# Patient Record
Sex: Male | Born: 1975 | Race: White | Hispanic: No | Marital: Married | Smoking: Current every day smoker
Health system: Southern US, Community
[De-identification: ages and names within clinical notes are randomized; demographics above are authoritative.]

## PROBLEM LIST (undated history)

## (undated) DIAGNOSIS — F419 Anxiety disorder, unspecified: Secondary | ICD-10-CM

---

## 2000-04-01 ENCOUNTER — Encounter: Payer: Self-pay | Admitting: Emergency Medicine

## 2000-04-01 ENCOUNTER — Emergency Department (HOSPITAL_COMMUNITY): Admission: EM | Admit: 2000-04-01 | Discharge: 2000-04-01 | Payer: Self-pay | Admitting: Emergency Medicine

## 2008-12-18 ENCOUNTER — Emergency Department (HOSPITAL_COMMUNITY): Admission: EM | Admit: 2008-12-18 | Discharge: 2008-12-18 | Payer: Self-pay | Admitting: Emergency Medicine

## 2009-01-18 ENCOUNTER — Emergency Department (HOSPITAL_COMMUNITY): Admission: EM | Admit: 2009-01-18 | Discharge: 2009-01-18 | Payer: Self-pay | Admitting: Emergency Medicine

## 2010-09-02 IMAGING — CT CT ABDOMEN W/ CM
3 of 5 series · 13 of 32 positions shown, 18 images · IV contrast (100 ML OMNI 300)
Comparison: None

CT CHEST

CLINICAL DATA: Status post trauma

CT CHEST, ABDOMEN AND PELVIS WITH CONTRAST
TECHNIQUE: Multidetector CT imaging of the chest, abdomen and
pelvis was performed following the standard protocol during bolus
administration of intravenous contrast.
Contrast: 100 ml of [id number]

[Series 8: chest/abd/pelvis · axial · 0.70mm/px · z∈[-844,-724]mm · 3 of 122 slices shown]
[im 13/122  soft-tissue]
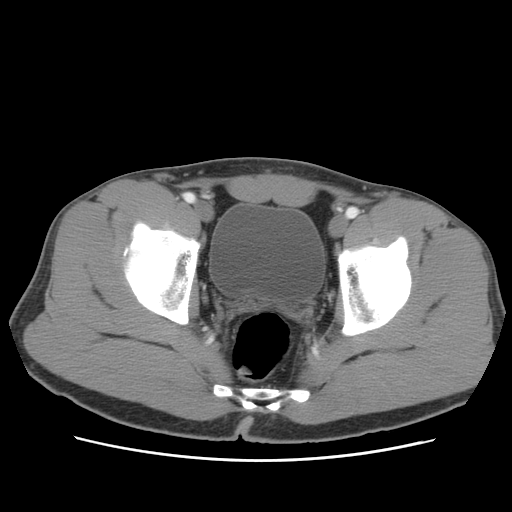
[im 25/122  soft-tissue]
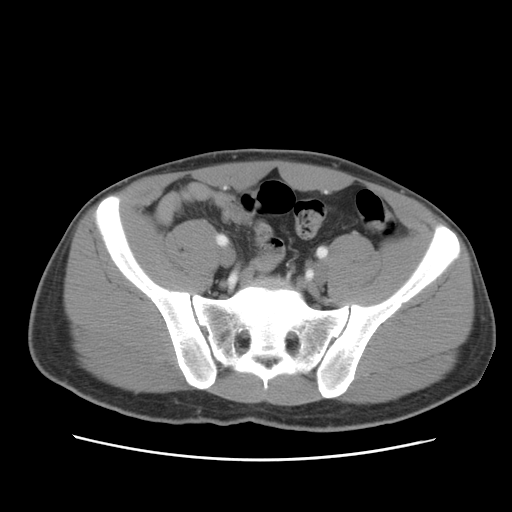
[im 37/122  soft-tissue]
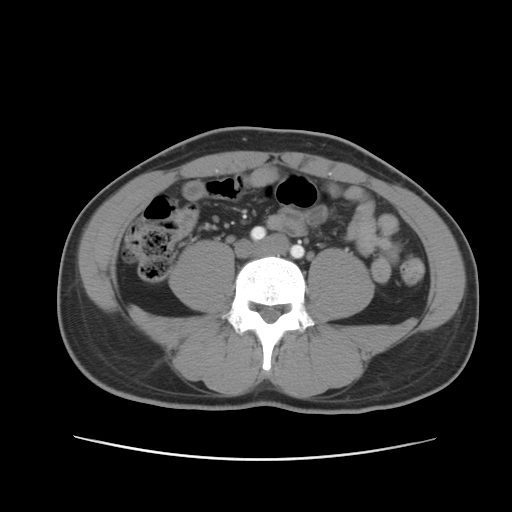

[Series 1000: reformatted · sagittal · 1.21mm/px · 6 of 98 slices shown, 11 images (1 of 2)]
[im 14/98  soft-tissue]
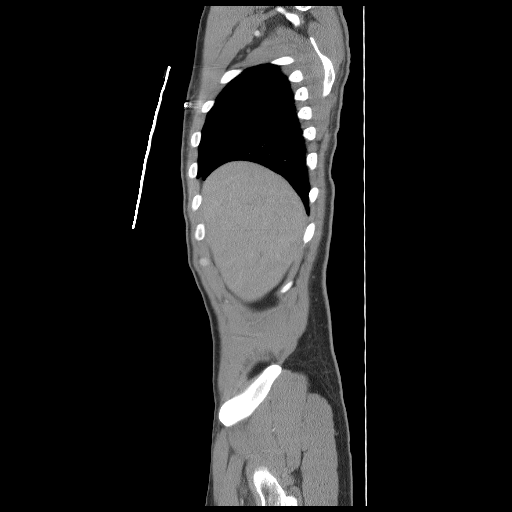
[im 14/98  lung]
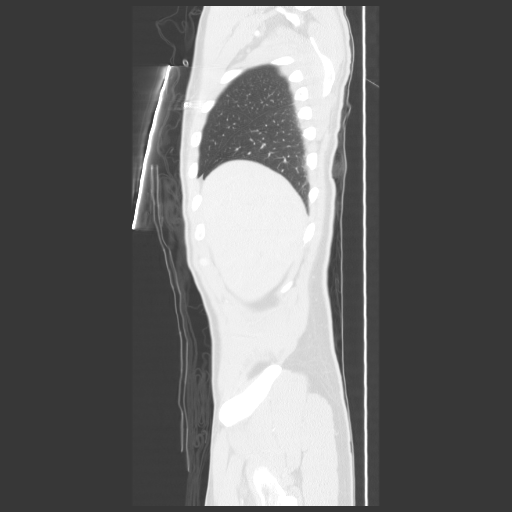
[im 14/98  bone]
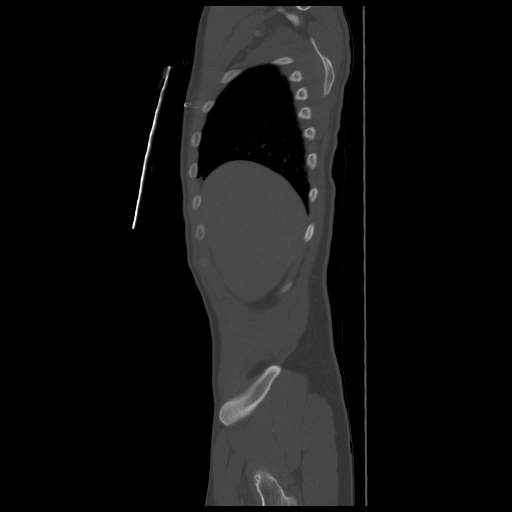
[im 28/98  soft-tissue]
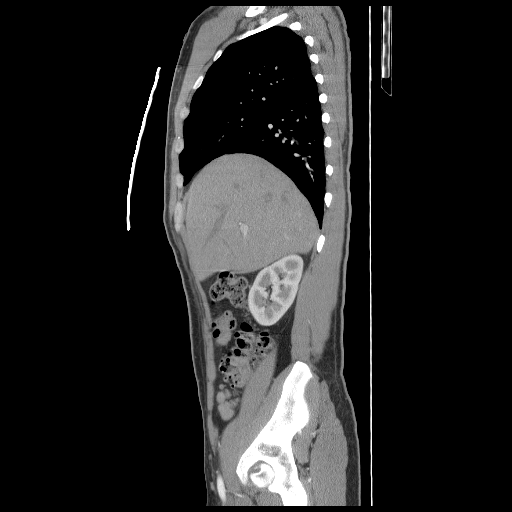
[im 28/98  lung]
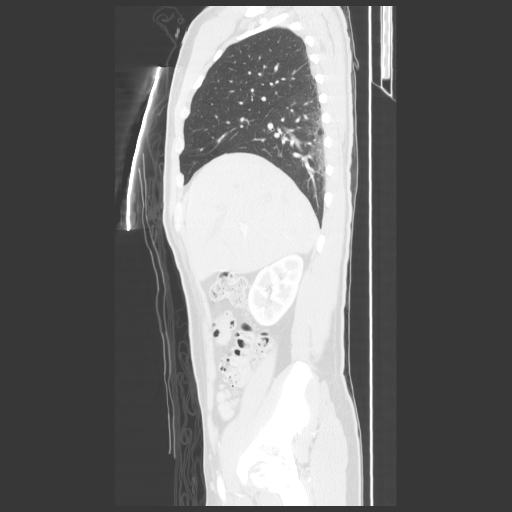
[im 42/98  soft-tissue]
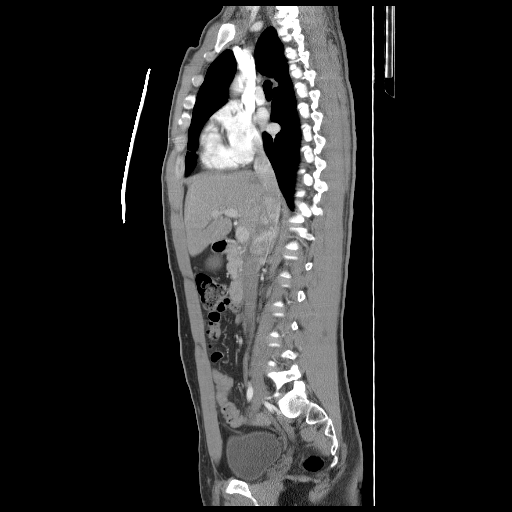
[im 42/98  lung]
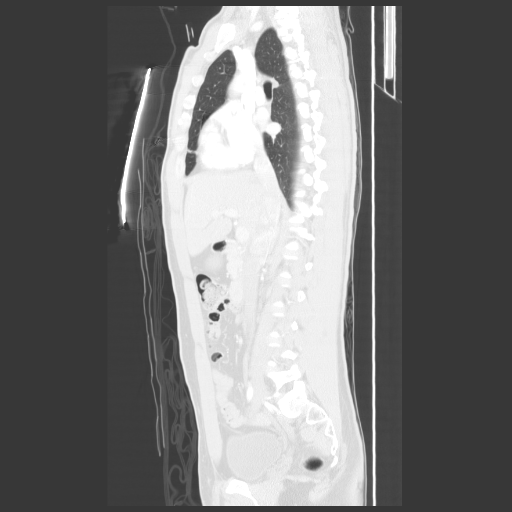
[im 56/98  soft-tissue]
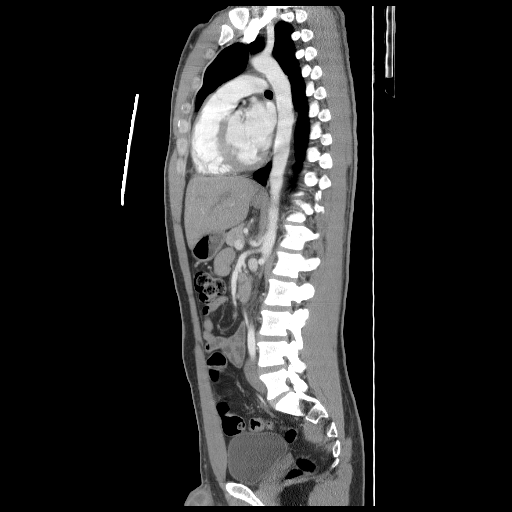
[im 56/98  lung]
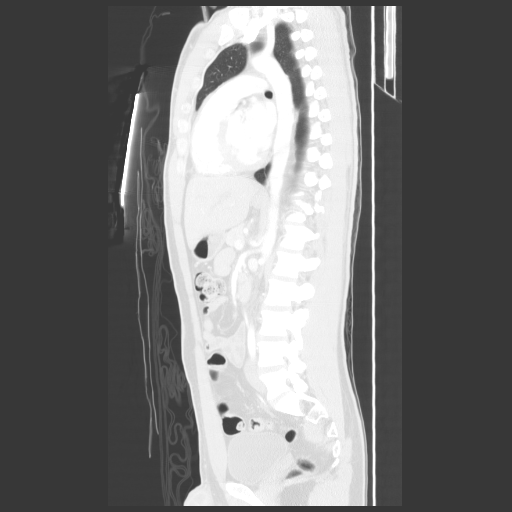
[im 70/98  soft-tissue]
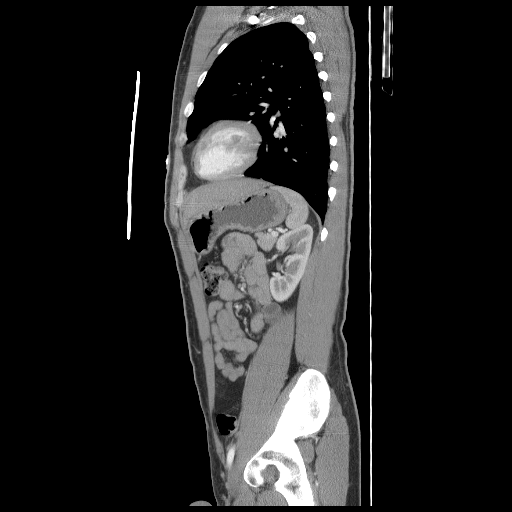
[im 84/98  soft-tissue]
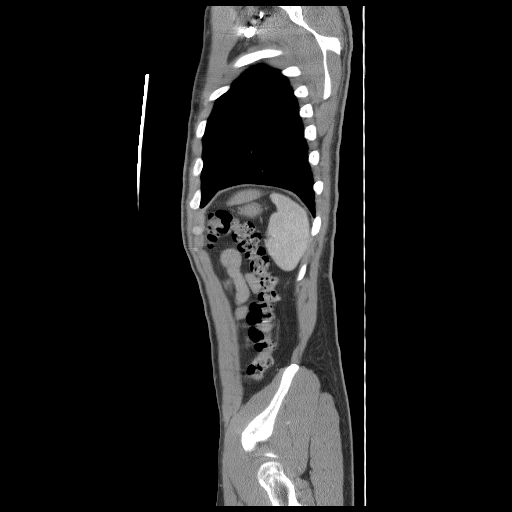

[Series 1001: reformatted · coronal · 1.21mm/px · 4 of 73 slices shown (2 of 2)]
[im 15/73  soft-tissue]
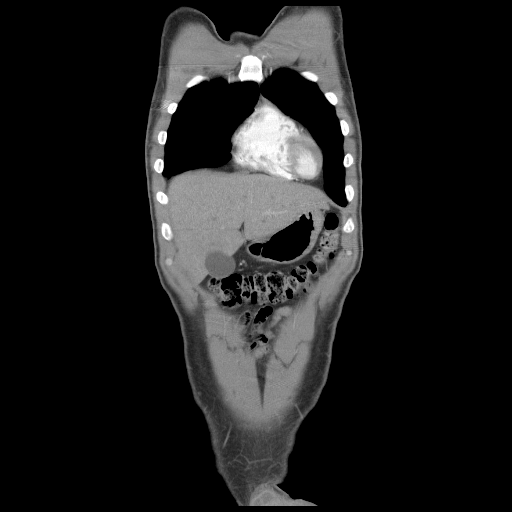
[im 29/73  soft-tissue]
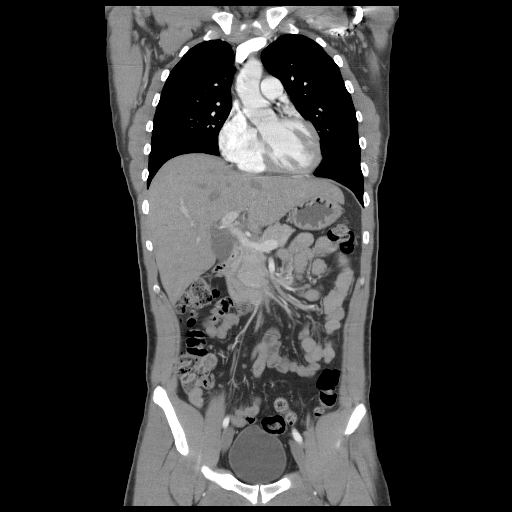
[im 44/73  soft-tissue]
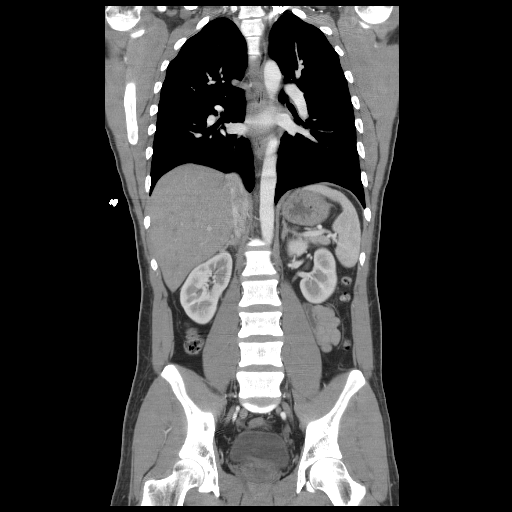
[im 58/73  soft-tissue]
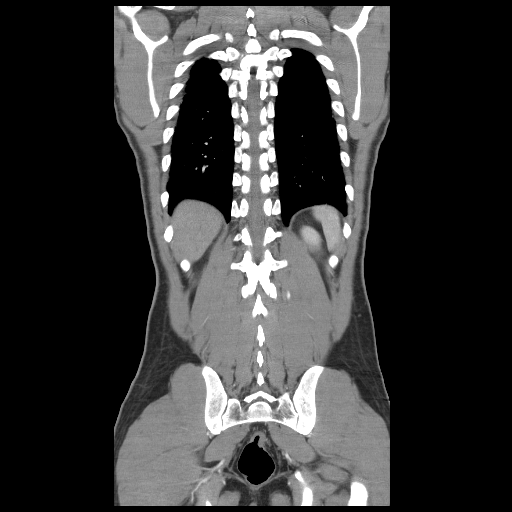

[13 of 32 positions shown; findings below may reference images not displayed]

FINDINGS: Images of the thoracic inlet are unremarkable.  No acute
fractures are noted.  There is no pneumothorax.  No lung contusion
is suggested.  Mild bilateral posterior atelectasis noted.

The central airways are patent.  There is no mediastinal hematoma.
No aortic dissection or aortic injury is suggested.  No adenopathy.

No acute infiltrate or pleural effusion.  Heart size is within
normal limits.  No pericardial effusion.

No pulmonary edema.
IMPRESSION: 1.  No acute traumatic injury within chest.
2.  No mediastinal hematoma.
3.  No diagnostic pneumothorax.  No lung contusion.

CT ABDOMEN
FINDINGS: The enhanced liver, spleen, pancreas and adrenals are
unremarkable.

No fractures are noted within abdomen.

There is no aortic aneurysm or evidence of aortic injury.

The kidneys show symmetrical size and enhancement without focal
mass.  No hydronephrosis or hydroureter.

Delayed renal images shows bilateral renal symmetrical excretion.
Bilateral visualized proximal ureter is unremarkable.

No calcified gallstones are noted within gallbladder.  No bowel
obstruction.  No ascites or free air.  No adenopathy.
IMPRESSION: 1.  No acute visceral injury within abdomen.

2.  No bowel obstruction.  No ascites or free air.
3.  No hydronephrosis or hydroureter.

CT PELVIS
FINDINGS: No acute fractures are noted within pelvis.  Normal
appendix is clearly visualized in axial image 91/122.  The iliac
arteries are unremarkable.  The urinary bladder is unremarkable
without evidence of bladder injury.  Prostate gland and seminal
vesicles are unremarkable. No pelvic ascites or adenopathy.
IMPRESSION: 1.  Normal appendix.
2.  No evidence of bladder injury.  No pelvic ascites or
adenopathy.

## 2010-09-18 ENCOUNTER — Inpatient Hospital Stay (HOSPITAL_COMMUNITY)
Admission: EM | Admit: 2010-09-18 | Discharge: 2010-09-19 | Payer: Self-pay | Attending: Internal Medicine | Admitting: Internal Medicine

## 2010-10-19 NOTE — Discharge Summary (Addendum)
  NAMESAYAN, ALDAVA              ACCOUNT NO.:  [id number]  MEDICAL RECORD NO.:  [id number]          PATIENT TYPE:  INP  LOCATION:  3730                         FACILITY:  [facility]  PHYSICIAN:  Bevelyn Buckles. Bensimhon, MDDATE OF BIRTH:  12-30-1975  DATE OF ADMISSION:  09/18/2010 DATE OF DISCHARGE:  09/19/2010                              DISCHARGE SUMMARY   PRIMARY CARDIOLOGIST:  Bevelyn Buckles. Bensimhon, MD  DISCHARGE DIAGNOSES: 1. Chest pain without objective evidence of ischemia. 2. History of polysubstance abuse.  ALLERGIES:  No known drug allergies.  PROCEDURES:  Diagnostic cardiac catheterization performed on September 19, 2010, showing normal coronary arteries and EF 55%.  HISTORY OF PRESENT ILLNESS:  This is a 35 year old male without significant prior past medical history with the exception of polysubstance abuse who was in his usual state of health until several months prior to admission when he began to experience discomfort in his chest when drinking chronic beverages.  He had not been having exertional chest discomfort.  On the morning of September 17, 2010, the patient awoke with severe pain in his neck and shoulder which radiated to his chest and got worse throughout the day.  He presented to the [facility] Emergency Department after about 14 hours of ongoing pain where enzymes were negative and CT scan of the chest was also within normal limits.  ECG showed early repolarization with question of mild PR depression.  The patient was transferred [facility] the [facility] for further evaluation.  HOSPITAL COURSE:  The patient ruled out for MI despite prolonged symptoms.  After discussion, the patient preferred cardiac catheterization over a stress testing for further evaluation of his chest pain.  Catheterization this morning showed normal coronary arteries with normal LV function and EF 55%.  The patient will be discharged home today in good condition.  DISCHARGE  LABORATORY DATA:  Hemoglobin 11.6, hematocrit 35.5, WBC 9.1, and platelets 216.  INR 1.02.  Sodium 142, potassium 4.04, chloride 109, CO2 of 29, BUN 12, creatinine 0.95, glucose 112, calcium 9.0.  CK 84, MB 0.9, and troponin-I 0.01.  Total cholesterol 183, triglycerides 119, HDL 45, and LDL 114.  MRSA screen was negative.  DISPOSITION:  The patient will be discharged home today in good condition.  FOLLOWUP PLANS AND APPOINTMENTS:  The patient is advised to obtain a primary care provider and follow up in the next 2 weeks.  DISCHARGE MEDICATIONS: 1. Protonix 40 mg daily. 2. Hydrocodone/APAP 10/325 mg q.i.d. p.r.n. as previously prescribed. 3. Xanax 0.5 mg t.i.d. p.r.n. as previously prescribed.  OUTSTANDING LABORATORY DATA AND STUDIES:  None.  DURATION OF DISCHARGE ENCOUNTER:  35 minutes including physician time.     Nicolasa Ducking, ANP   ______________________________ Bevelyn Buckles. Bensimhon, MD    CB/MEDQ  D:  09/19/2010  T:  09/19/2010  Job:  [id number]  Electronically Signed by Nicolasa Ducking ANP on 10/16/2010 03:35:15 PM Electronically Signed by Arvilla Meres MD on 10/19/2010 04:02:46 PM

## 2010-12-04 LAB — CARDIAC PANEL(CRET KIN+CKTOT+MB+TROPI)
CK, MB: 0.9 ng/mL (ref 0.3–4.0)
CK, MB: 1.3 ng/mL (ref 0.3–4.0)
Relative Index: 1.3 (ref 0.0–2.5)
Relative Index: INVALID (ref 0.0–2.5)
Total CK: 104 U/L (ref 7–232)
Total CK: 84 U/L (ref 7–232)

## 2010-12-04 LAB — CBC
HCT: 35.5 % — ABNORMAL LOW (ref 39.0–52.0)
Hemoglobin: 11.6 g/dL — ABNORMAL LOW (ref 13.0–17.0)
MCV: 86 fL (ref 78.0–100.0)
Platelets: 222 10*3/uL (ref 150–400)
RBC: 4.3 MIL/uL (ref 4.22–5.81)
RDW: 13.1 % (ref 11.5–15.5)
WBC: 9 10*3/uL (ref 4.0–10.5)
WBC: 9.1 10*3/uL (ref 4.0–10.5)

## 2010-12-04 LAB — BASIC METABOLIC PANEL
Calcium: 9 mg/dL (ref 8.4–10.5)
GFR calc Af Amer: >60 mL/min (ref >60)
GFR calc non Af Amer: >60 mL/min (ref >60)
Potassium: 4 mEq/L (ref 3.5–5.1)
Sodium: 142 mEq/L (ref 135–145)

## 2010-12-04 LAB — LIPID PANEL
Cholesterol: 183 mg/dL (ref 0–200)
HDL: 45 mg/dL (ref >39)
LDL Cholesterol: 114 mg/dL — ABNORMAL HIGH (ref 0–99)
Triglycerides: 119 mg/dL (ref <150)

## 2010-12-04 LAB — SEDIMENTATION RATE: Sed Rate: 4 mm/hr (ref 0–16)

## 2010-12-04 LAB — HEPARIN LEVEL (UNFRACTIONATED): Heparin Unfractionated: 0.33 IU/mL (ref 0.30–0.70)

## 2010-12-04 LAB — PROTIME-INR: Prothrombin Time: 13.6 seconds (ref 11.6–15.2)

## 2011-01-03 LAB — URINALYSIS, MICROSCOPIC ONLY
Bilirubin Urine: NEGATIVE
Hgb urine dipstick: NEGATIVE
Nitrite: NEGATIVE
Protein, ur: NEGATIVE mg/dL
Specific Gravity, Urine: 1.009 (ref 1.005–1.030)
Urobilinogen, UA: 0.2 mg/dL (ref 0.0–1.0)

## 2011-01-03 LAB — PROTIME-INR
INR: 1.1 (ref 0.00–1.49)
Prothrombin Time: 14.4 seconds (ref 11.6–15.2)

## 2011-01-03 LAB — POCT I-STAT, CHEM 8
BUN: 11 mg/dL (ref 6–23)
Calcium, Ion: 1.17 mmol/L (ref 1.12–1.32)
Creatinine, Ser: 1.3 mg/dL (ref 0.4–1.5)
Glucose, Bld: 104 mg/dL — ABNORMAL HIGH (ref 70–99)
Hemoglobin: 14.3 g/dL (ref 13.0–17.0)
Sodium: 141 mEq/L (ref 135–145)
TCO2: 28 mmol/L (ref 0–100)

## 2011-01-03 LAB — CBC
MCHC: 33.4 g/dL (ref 30.0–36.0)
MCV: 86.4 fL (ref 78.0–100.0)
Platelets: 222 10*3/uL (ref 150–400)
RBC: 4.41 MIL/uL (ref 4.22–5.81)
WBC: 5 10*3/uL (ref 4.0–10.5)

## 2016-02-04 ENCOUNTER — Emergency Department (HOSPITAL_COMMUNITY)
Admission: EM | Admit: 2016-02-04 | Discharge: 2016-02-06 | Disposition: A | Discharge status: Other Acute Inpatient Hospital | Payer: Self-pay | Attending: Emergency Medicine | Admitting: Emergency Medicine

## 2016-02-04 ENCOUNTER — Encounter (HOSPITAL_COMMUNITY): Payer: Self-pay | Admitting: *Deleted

## 2016-02-04 DIAGNOSIS — F1910 Other psychoactive substance abuse, uncomplicated: Secondary | ICD-10-CM

## 2016-02-04 DIAGNOSIS — R4589 Other symptoms and signs involving emotional state: Secondary | ICD-10-CM

## 2016-02-04 DIAGNOSIS — R45851 Suicidal ideations: Secondary | ICD-10-CM | POA: Insufficient documentation

## 2016-02-04 DIAGNOSIS — R4689 Other symptoms and signs involving appearance and behavior: Secondary | ICD-10-CM

## 2016-02-04 DIAGNOSIS — F17210 Nicotine dependence, cigarettes, uncomplicated: Secondary | ICD-10-CM | POA: Insufficient documentation

## 2016-02-04 HISTORY — DX: Anxiety disorder, unspecified: F41.9

## 2016-02-04 LAB — COMPREHENSIVE METABOLIC PANEL
ALBUMIN: 3.8 g/dL (ref 3.5–5.0)
ALK PHOS: 53 U/L (ref 38–126)
ALT: 55 U/L (ref 17–63)
ANION GAP: 4 — AB (ref 5–15)
AST: 45 U/L — ABNORMAL HIGH (ref 15–41)
BILIRUBIN TOTAL: 0.2 mg/dL — AB (ref 0.3–1.2)
BUN: 7 mg/dL (ref 6–20)
CALCIUM: 8.3 mg/dL — AB (ref 8.9–10.3)
CO2: 25 mmol/L (ref 22–32)
Chloride: 111 mmol/L (ref 101–111)
Creatinine, Ser: 0.95 mg/dL (ref 0.61–1.24)
Glucose, Bld: 101 mg/dL — ABNORMAL HIGH (ref 65–99)
POTASSIUM: 3.5 mmol/L (ref 3.5–5.1)
Sodium: 140 mmol/L (ref 135–145)
TOTAL PROTEIN: 6.9 g/dL (ref 6.5–8.1)

## 2016-02-04 LAB — ACETAMINOPHEN LEVEL

## 2016-02-04 LAB — CBC WITH DIFFERENTIAL/PLATELET
BASOS ABS: 0 10*3/uL (ref 0.0–0.1)
BASOS PCT: 0 %
EOS ABS: 0.2 10*3/uL (ref 0.0–0.7)
Eosinophils Relative: 2 %
HEMATOCRIT: 43.3 % (ref 39.0–52.0)
HEMOGLOBIN: 14.1 g/dL (ref 13.0–17.0)
Lymphocytes Relative: 31 %
Lymphs Abs: 3.5 10*3/uL (ref 0.7–4.0)
MCH: 28.9 pg (ref 26.0–34.0)
MCHC: 32.6 g/dL (ref 30.0–36.0)
MCV: 88.7 fL (ref 78.0–100.0)
MONOS PCT: 4 %
Monocytes Absolute: 0.5 10*3/uL (ref 0.1–1.0)
NEUTROS ABS: 7.2 10*3/uL (ref 1.7–7.7)
NEUTROS PCT: 63 %
Platelets: 306 10*3/uL (ref 150–400)
RBC: 4.88 MIL/uL (ref 4.22–5.81)
RDW: 13.2 % (ref 11.5–15.5)
WBC: 11.5 10*3/uL — ABNORMAL HIGH (ref 4.0–10.5)

## 2016-02-04 LAB — RAPID URINE DRUG SCREEN, HOSP PERFORMED
Amphetamines: NOT DETECTED
BARBITURATES: NOT DETECTED
Benzodiazepines: POSITIVE — AB
Cocaine: POSITIVE — AB
OPIATES: NOT DETECTED
TETRAHYDROCANNABINOL: NOT DETECTED

## 2016-02-04 LAB — SALICYLATE LEVEL: Salicylate Lvl: <4 mg/dL (ref 2.8–30.0)

## 2016-02-04 LAB — ETHANOL: ALCOHOL ETHYL (B): 219 mg/dL — AB (ref <5)

## 2016-02-04 MED ORDER — LORAZEPAM 1 MG PO TABS
0.0000 mg | ORAL_TABLET | Freq: Four times a day (QID) | ORAL | Status: DC
  Administered 2016-02-05 – 2016-02-06 (×3): 1 mg via ORAL
  Administered 2016-02-06: 0.5 mg via ORAL
  Filled 2016-02-04 (×5): qty 1

## 2016-02-04 MED ORDER — LORAZEPAM 1 MG PO TABS: 0.0000 mg | ORAL_TABLET | Freq: Two times a day (BID) | ORAL | Status: DC

## 2016-02-04 NOTE — BH Assessment (Addendum)
Tele Assessment Note   Jeremy Callahan is an 40 y.o. male [facility] to [facility] [place] [facility]. [employer] was called to pt's home due to pt threatening to kill himself and having his hand on the gun in the home. Pt reported that he was trying to kill himself; however he did not share any additional details with this Clinical research associatewriter. Pt did not report any previous suicide attempts or psychiatric hospitalizations. Pt denies psychosis at this time. Pt reported that while he was incarcerated his wife lost custody of his son and his bother has custody but does not allow him to see him. When pt was asked about his drug and alcohol use pt stated "any and everything I can get my hands on". Pt's UDS is positive for cocaine and benzodiazepines.  Inpatient treatment is recommended for safety and stabilization.   Diagnosis: Substance induced mood disorder   Past Medical History:  Past Medical History  Diagnosis Date  . Anxiety     History reviewed. No pertinent past surgical history.  Family History: History reviewed. No pertinent family history.  Social History:  reports that he has been smoking Cigarettes.  He does not have any smokeless tobacco history on file. He reports that he drinks about 3.6 - 7.2 oz of alcohol per week. He reports that he uses illicit drugs (Cocaine, IV, Marijuana, and Methamphetamines).  Additional Social History:  Alcohol / Drug Use History of alcohol / drug use?: Yes ("any and everything I can get my hands on". )  CIWA: CIWA-Ar BP: 114/75 mmHg Pulse Rate: 111 COWS:    PATIENT STRENGTHS: (choose at least two) Average or above average intelligence General fund of knowledge  Allergies: No Known Allergies  Home Medications:  (Not in a hospital admission)  OB/GYN Status:  No LMP for male patient.  General Assessment Data Location of Assessment: [facility] T[facility] Assessment: In system Is this a Tele or Face-to-Face Assessment?: Face-to-Face Is this an  Initial Assessment or a Re-assessment for this encounter?: Initial Assessment Marital status: Separated Living Arrangements: Parent Can pt return to current living arrangement?: Yes Admission Status: Involuntary (IVC papers in process, however pt came willingly. ) Is patient capable of signing voluntary admission?: Yes     Crisis Care Plan Living Arrangements: Parent Name of Psychiatrist: No provider reported  Name of Therapist: No provider reported.   Education Status Is patient currently in school?: No Current Grade: N/A Highest grade of school patient has completed: 10 Name of school: N/A Contact person: N/A  Risk to self with the past 6 months Suicidal Ideation: Yes-Currently Present Has patient been a risk to self within the past 6 months prior to admission? : No Suicidal Intent: Yes-Currently Present Has patient had any suicidal intent within the past 6 months prior to admission? : Other (comment) Is patient at risk for suicide?: Yes Suicidal Plan?: Yes-Currently Present Has patient had any suicidal plan within the past 6 months prior to admission? : No Specify Current Suicidal Plan: shoot self  Access to Means: Yes Specify Access to Suicidal Means: It has been documented that pt had his hand on a gun.  What has been your use of drugs/alcohol within the last 12 months?: "any and everything I can get my hands on"  Previous Attempts/Gestures: No How many times?: 0 Other Self Harm Risks: Pt denies  Intentional Self Injurious Behavior: None Family Suicide History: Unknown Recent stressful life event(s): Other (Comment) (loss custody of son. ) Persecutory voices/beliefs?: No  Depression:  (Unable to assess) Depression Symptoms: Feeling angry/irritable (Unable to assess) Substance abuse history and/or treatment for substance abuse?: Yes Suicide prevention information given to non-admitted patients: Not applicable  Risk to Others within the past 6 months Homicidal  Ideation: No Does patient have any lifetime risk of violence toward others beyond the six months prior to admission? : No Thoughts of Harm to Others: No Current Homicidal Intent: No Current Homicidal Plan: No Access to Homicidal Means: No Identified Victim: N/A History of harm to others?: No Assessment of Violence: None Noted Does patient have access to weapons?: Yes (Comment) Criminal Charges Pending?: No Is patient on probation?: Unknown  Psychosis Hallucinations: None noted Delusions: None noted  Mental Status Report Appearance/Hygiene: In scrubs Eye Contact: Fair Motor Activity: Freedom of movement Speech: Argumentative, Aggressive, Abusive Level of Consciousness: Irritable Mood: Irritable Affect: Blunted Anxiety Level: Minimal Thought Processes: Coherent, Relevant Judgement: Impaired Orientation: Appropriate for developmental age Obsessive Compulsive Thoughts/Behaviors: None  Cognitive Functioning Concentration: Unable to Assess Memory: Unable to Assess IQ: Average Insight: Poor Impulse Control: Poor Appetite:  (UTA) Weight Loss:  (UTA) Weight Gain:  (UTA) Sleep: Unable to Assess Vegetative Symptoms: Unable to Assess  ADLScreening [facility]) Patient's cognitive ability adequate to safely complete daily activities?: Yes Patient able to express need for assistance with ADLs?: Yes Independently performs ADLs?: Yes (appropriate for developmental age)  Prior Inpatient Therapy Prior Inpatient Therapy: No  Prior Outpatient Therapy Prior Outpatient Therapy: No Does patient have an ACCT team?: Unknown Does patient have Intensive In-House Services?  : No Does patient have [facility] services? : No Does patient have P4CC services?: No  ADL Screening (condition at time of admission) Patient's cognitive ability adequate to safely complete daily activities?: Yes Is the patient deaf or have difficulty hearing?: No Does the patient have difficulty seeing,  even when wearing glasses/contacts?: No Does the patient have difficulty concentrating, remembering, or making decisions?: No Patient able to express need for assistance with ADLs?: Yes Does the patient have difficulty dressing or bathing?: No Independently performs ADLs?: Yes (appropriate for developmental age) Does the patient have difficulty walking or climbing stairs?: No       Abuse/Neglect Assessment (Assessment to be complete while patient is alone) Physical Abuse:  (Unable to assess) Verbal Abuse:  (Unable to assess) Sexual Abuse:  (Unable to assess) Exploitation of patient/patient's resources:  (Unable to assess) Self-Neglect:  (Unable to assess)     Advance Directives (For Healthcare) Does patient have an advance directive?: No Would patient like information on creating an advanced directive?: No - patient declined information    Additional Information 1:1 In Past 12 Months?: Yes CIRT Risk: Yes Elopement Risk: Yes Does patient have medical clearance?: Yes     Disposition:  Disposition Initial Assessment Completed for this Encounter: Yes Disposition of Patient: Inpatient treatment program Type of inpatient treatment program: Adult  Doree Kuehne S 02/04/2016 10:08 PM

## 2016-02-04 NOTE — BH Assessment (Signed)
Assessment completed. Consulted Maryjean Mornharles Kober, PA-C who recommended inpatient. TTS to seek placement. Informed Dr. Hyacinth MeekerMiller of the recommendation.

## 2016-02-04 NOTE — ED Notes (Signed)
Pt consumed 100% of dinner tray.

## 2016-02-04 NOTE — ED Notes (Addendum)
Pt was picked up and brought in to the ED by [employer]. [employer] states they were called out to this pt's dads house and when they got there, the patient was being restrained by his father. [employer] states that the pt pulled a pistol out of a drawer and was threatening to kill himself. Pt then spoke up in triage stating, "oh, I wasn't threatening, it was damn promise." Pt states he he has wanted to commit suicide for 2 years. Pt is intoxicated at this time.

## 2016-02-04 NOTE — ED Provider Notes (Signed)
CSN: [id number]     Arrival date & time 02/04/16  2059 History  By signing my name below, I, Bethel Born, attest that this documentation has been prepared under the direction and in the presence of Eber Hong, MD. Electronically Signed: Bethel Born, ED Scribe. 02/04/2016. 9:40 PM    Chief Complaint  Patient presents with  . V70.1   The history is provided by the police and the patient. No language interpreter was used.   Brought in by [facility] Deputies, Jeremy Callahan is a 40 y.o. male with history of anxiety who presents to the Emergency Department for evaluation of SI. The sheriff's deputy at bedside reports that he was called to the home of the patient's parents for a report that the pt wanted to kill himself and had his hands on a gun in the home. The pt was being restrained by family when sheriff's deputies arrived.  Pt states "in general, everything I love gets taken away from me" and admits that he planned to end his life tonight. He states " I still haven't changed my mind about that". The pt lost custody of his son in 2016 and is under stress because his brother, who has custody, will not open the door to allow him to visit. Pt states "My son is the only one that cares about me". Pt admits to IV drug and heavy alcohol use. He is not on any prescription medication and denies previous suicide attempts.   Past Medical History  Diagnosis Date  . Anxiety    History reviewed. No pertinent past surgical history. History reviewed. No pertinent family history. Social History  Substance Use Topics  . Smoking status: Current Every Day Smoker    Types: Cigarettes  . Smokeless tobacco: None  . Alcohol Use: 3.6 - 7.2 oz/week    6-12 Cans of beer per week     Comment: 6-12 cans a day for the last 2 months.    Review of Systems  Psychiatric/Behavioral: Positive for suicidal ideas.   Allergies  Review of patient's allergies indicates no known allergies.  Home  Medications   Prior to Admission medications   Not on File   BP 114/75 mmHg  Pulse 111  Temp(Src) 99 F (37.2 C) (Oral)  Resp 16  Ht  (1.676 m)  SpO2 96% Physical Exam  Constitutional: He appears well-developed and well-nourished. No distress.  HENT:  Head: Normocephalic and atraumatic.  Mouth/Throat: Oropharynx is clear and moist. No oropharyngeal exudate.  Eyes: Conjunctivae and EOM are normal. Pupils are equal, round, and reactive to light. Right eye exhibits no discharge. Left eye exhibits no discharge. No scleral icterus.  Neck: Normal range of motion. Neck supple. No JVD present. No thyromegaly present.  Cardiovascular: Normal rate, regular rhythm, normal heart sounds and intact distal pulses.  Exam reveals no gallop and no friction rub.   No murmur heard. Pulmonary/Chest: Effort normal and breath sounds normal. No respiratory distress. He has no wheezes. He has no rales.  Abdominal: Soft. Bowel sounds are normal. He exhibits no distension and no mass. There is no tenderness.  Musculoskeletal: Normal range of motion. He exhibits no edema or tenderness.  Lymphadenopathy:    He has no cervical adenopathy.  Neurological: He is alert. Coordination normal.  Skin: Skin is warm and dry. No rash noted. No erythema.  Track marks - old, nothing infected  Psychiatric:  Tearful, suicidal, talking in repetitive  Circles about  Loss of his son  (  custody)  Nursing note and vitals reviewed.   ED Course  Procedures (including critical care time) DIAGNOSTIC STUDIES: Oxygen Saturation is 96% on RA,  normal by my interpretation.    COORDINATION OF CARE: 9:36 PM Treatment plan includes lab work and IVC.   Labs Review Labs Reviewed  COMPREHENSIVE METABOLIC PANEL - Abnormal; Notable for the following:    Glucose, Bld 101 (*)    Calcium 8.3 (*)    AST 45 (*)    Total Bilirubin 0.2 (*)    Anion gap 4 (*)    All other components within normal limits  ETHANOL - Abnormal; Notable  for the following:    Alcohol, Ethyl (B) 219 (*)    All other components within normal limits  CBC WITH DIFFERENTIAL/PLATELET - Abnormal; Notable for the following:    WBC 11.5 (*)    All other components within normal limits  URINE RAPID DRUG SCREEN, HOSP PERFORMED - Abnormal; Notable for the following:    Cocaine POSITIVE (*)    Benzodiazepines POSITIVE (*)    All other components within normal limits  ACETAMINOPHEN LEVEL - Abnormal; Notable for the following:    Acetaminophen (Tylenol), Serum <10 (*)    All other components within normal limits  SALICYLATE LEVEL    Imaging Review No results found. I have personally reviewed and evaluated these lab results as part of my medical decision-making.  MDM  Plan for IVC for threats against himself with the presence of a gun at the house.  Final diagnoses:  Suicidal behavior  Substance abuse    I personally performed the services described in this documentation, which was scribed in my presence. The recorded information has been reviewed and is accurate.    The pt continues to have suicidal statements.  i have done the IVC - he has been seen by TTS and they will place pt in inpatient setting.  Pt denies OD, labs supportive of same  meds and CIWA ordered   Eber HongBrian Juanangel Soderholm, MD 02/04/16 2303

## 2016-02-04 NOTE — ED Notes (Signed)
Pt wanded in triage by security.  

## 2016-02-04 NOTE — ED Notes (Signed)
When pt was asked what drugs he has done today, the pt states "honestly, I don't know." "Probably a little bit of everything." "Not enough that's for sure."

## 2016-02-04 NOTE — ED Notes (Signed)
Pt undergarments, jeans, socks, shoes, and shirt were placed in bag and placed in locker. Belongings locked and secured.

## 2016-02-04 NOTE — ED Notes (Signed)
Pt states he could give "damn less" if he died tonight.

## 2016-02-05 MED ORDER — LORAZEPAM 1 MG PO TABS
1.0000 mg | ORAL_TABLET | Freq: Once | ORAL | Status: AC
  Administered 2016-02-05: 1 mg via ORAL

## 2016-02-05 MED ORDER — HYDROXYZINE HCL 25 MG PO TABS
50.0000 mg | ORAL_TABLET | Freq: Once | ORAL | Status: AC
  Administered 2016-02-05: 50 mg via ORAL
  Filled 2016-02-05: qty 2

## 2016-02-05 MED ORDER — NICOTINE 21 MG/24HR TD PT24
21.0000 mg | MEDICATED_PATCH | Freq: Once | TRANSDERMAL | Status: AC
  Administered 2016-02-05: 21 mg via TRANSDERMAL

## 2016-02-05 MED ORDER — NICOTINE 21 MG/24HR TD PT24
MEDICATED_PATCH | TRANSDERMAL | Status: AC
  Filled 2016-02-05: qty 1

## 2016-02-05 NOTE — ED Notes (Addendum)
Pt reports that he started drinking at age 40 and drinks because he likes it. Also reports using cocaine for the same reasons. Feels overwhelmed by panic attacks, thoughts of problems he cannot solve and reports that in past this has been helped by walking in the woods as well as driving. He has a 39 yr old son he does not have custody of but sees. He is tearful with pressured speech, has very poor insight as he reports his problems. He states that he used Xanax TID for his panic attacks in the past and when he feels these coming on, acts out and is anxious. He denies N/V cramping, his tongue is non tremulous. He reports that he is hungry and that the meals here are not enough. Meal heated and provided. MD informed of pt report of anxiety with med ordered. Inclusive pronouns used to establish bridge and partnership with pt. He is by his report overwhelmed by his problems and cannot see a way out. He omplies that he cintinues to be SI, but denies HI, A/V hallucinations

## 2016-02-05 NOTE — ED Notes (Signed)
Pt requesting nicotine patch.

## 2016-02-05 NOTE — ED Notes (Signed)
New [employer] officer at bedside.

## 2016-02-05 NOTE — ED Notes (Signed)
Pt sleeping at this time. Equal rise and fall of chest noted. 

## 2016-02-05 NOTE — ED Notes (Signed)
Pt reports less anxious- sitting in recliner watching TV and has eaten his dinner

## 2016-02-05 NOTE — BH Assessment (Signed)
[facility]      Faxed referrals to: [facility]

## 2016-02-05 NOTE — ED Notes (Signed)
New R[employer]O at bedside. Family also at bedside.

## 2016-02-05 NOTE — ED Notes (Signed)
Pt finished 100% of meal tray.

## 2016-02-05 NOTE — ED Notes (Signed)
Pt given meal tray.

## 2016-02-05 NOTE — ED Notes (Signed)
Pt standing at bedside, pacing with cuff still attached to the bed. Pt saying many cussing at staff. Pt states that he has a lot going on " a lot of shit is going on in my life. You don't understand. I want to leave here and get my own help."  MD made aware. Police at bedside

## 2016-02-05 NOTE — ED Notes (Signed)
Pt is currently on his stretcher, covered with eyes closed with even, unlabored respirations

## 2016-02-05 NOTE — ED Notes (Signed)
Pt given fluids per request.  

## 2016-02-05 NOTE — ED Notes (Addendum)
Skin assessment made. Pt has cuff on left ankle. No redness noted.

## 2016-02-05 NOTE — ED Notes (Signed)
Pt is sleeping at this time. Pt has equal rise and fall of chest noted.

## 2016-02-05 NOTE — ED Notes (Signed)
Pt cooperative at this time. However, pt states "I just want to leave here. I'm about get up and leave."   Police continue to be at bedside.

## 2016-02-05 NOTE — ED Notes (Signed)
Resting on right side appears asleep

## 2016-02-05 NOTE — ED Notes (Signed)
Pt sleeping at this time. Noted rise and fall of chest. No distress noted.

## 2016-02-05 NOTE — ED Notes (Signed)
Pt woke up requesting water.  Water given, pt back to sleep.

## 2016-02-06 ENCOUNTER — Encounter (HOSPITAL_COMMUNITY): Payer: Self-pay | Admitting: *Deleted

## 2016-02-06 ENCOUNTER — Inpatient Hospital Stay (HOSPITAL_COMMUNITY)
Admission: AD | Admit: 2016-02-06 | Discharge: 2016-02-10 | DRG: 897 | Disposition: A | Discharge status: Home / Self Care | Payer: No Typology Code available for payment source | Source: Intra-hospital | Attending: Psychiatry | Admitting: Psychiatry

## 2016-02-06 DIAGNOSIS — F1994 Other psychoactive substance use, unspecified with psychoactive substance-induced mood disorder: Secondary | ICD-10-CM | POA: Diagnosis present

## 2016-02-06 DIAGNOSIS — F329 Major depressive disorder, single episode, unspecified: Secondary | ICD-10-CM | POA: Diagnosis present

## 2016-02-06 DIAGNOSIS — F1920 Other psychoactive substance dependence, uncomplicated: Secondary | ICD-10-CM | POA: Diagnosis not present

## 2016-02-06 DIAGNOSIS — F1524 Other stimulant dependence with stimulant-induced mood disorder: Principal | ICD-10-CM | POA: Diagnosis present

## 2016-02-06 DIAGNOSIS — F1424 Cocaine dependence with cocaine-induced mood disorder: Secondary | ICD-10-CM | POA: Diagnosis present

## 2016-02-06 DIAGNOSIS — R45851 Suicidal ideations: Secondary | ICD-10-CM | POA: Diagnosis not present

## 2016-02-06 DIAGNOSIS — G4700 Insomnia, unspecified: Secondary | ICD-10-CM | POA: Diagnosis present

## 2016-02-06 DIAGNOSIS — F410 Panic disorder [episodic paroxysmal anxiety] without agoraphobia: Secondary | ICD-10-CM | POA: Diagnosis present

## 2016-02-06 DIAGNOSIS — F12288 Cannabis dependence with other cannabis-induced disorder: Secondary | ICD-10-CM | POA: Diagnosis present

## 2016-02-06 DIAGNOSIS — F17210 Nicotine dependence, cigarettes, uncomplicated: Secondary | ICD-10-CM | POA: Diagnosis present

## 2016-02-06 MED ORDER — NICOTINE POLACRILEX 2 MG MT GUM
2.0000 mg | CHEWING_GUM | OROMUCOSAL | Status: DC | PRN
  Administered 2016-02-06 – 2016-02-10 (×12): 2 mg via ORAL
  Filled 2016-02-06 (×7): qty 1

## 2016-02-06 MED ORDER — ONDANSETRON 4 MG PO TBDP: 4.0000 mg | ORAL_TABLET | Freq: Four times a day (QID) | ORAL | Status: AC | PRN

## 2016-02-06 MED ORDER — ACETAMINOPHEN 325 MG PO TABS: 650.0000 mg | ORAL_TABLET | Freq: Four times a day (QID) | ORAL | Status: DC | PRN

## 2016-02-06 MED ORDER — HYDROXYZINE HCL 25 MG PO TABS
25.0000 mg | ORAL_TABLET | Freq: Four times a day (QID) | ORAL | Status: AC | PRN
  Administered 2016-02-07 – 2016-02-08 (×3): 25 mg via ORAL
  Filled 2016-02-06 (×3): qty 1

## 2016-02-06 MED ORDER — ALUM & MAG HYDROXIDE-SIMETH 200-200-20 MG/5ML PO SUSP: 30.0000 mL | ORAL | Status: DC | PRN

## 2016-02-06 MED ORDER — OLANZAPINE 5 MG PO TBDP
5.0000 mg | ORAL_TABLET | Freq: Three times a day (TID) | ORAL | Status: DC | PRN
  Administered 2016-02-06: 5 mg via ORAL
  Filled 2016-02-06: qty 1

## 2016-02-06 MED ORDER — LORAZEPAM 1 MG PO TABS
1.0000 mg | ORAL_TABLET | Freq: Every day | ORAL | Status: AC
  Administered 2016-02-10: 1 mg via ORAL
  Filled 2016-02-06: qty 1

## 2016-02-06 MED ORDER — LORAZEPAM 1 MG PO TABS
1.0000 mg | ORAL_TABLET | Freq: Three times a day (TID) | ORAL | Status: AC
  Administered 2016-02-08 (×3): 1 mg via ORAL
  Filled 2016-02-06 (×3): qty 1

## 2016-02-06 MED ORDER — LORAZEPAM 1 MG PO TABS
1.0000 mg | ORAL_TABLET | Freq: Four times a day (QID) | ORAL | Status: AC | PRN
  Administered 2016-02-06 – 2016-02-07 (×3): 1 mg via ORAL
  Filled 2016-02-06 (×3): qty 1

## 2016-02-06 MED ORDER — LOPERAMIDE HCL 2 MG PO CAPS: 2.0000 mg | ORAL_CAPSULE | ORAL | Status: AC | PRN

## 2016-02-06 MED ORDER — VITAMIN B-1 100 MG PO TABS
100.0000 mg | ORAL_TABLET | Freq: Every day | ORAL | Status: DC
  Administered 2016-02-07 – 2016-02-10 (×4): 100 mg via ORAL
  Filled 2016-02-06 (×6): qty 1

## 2016-02-06 MED ORDER — LORAZEPAM 1 MG PO TABS
1.0000 mg | ORAL_TABLET | Freq: Two times a day (BID) | ORAL | Status: AC
  Administered 2016-02-09 (×2): 1 mg via ORAL
  Filled 2016-02-06 (×2): qty 1

## 2016-02-06 MED ORDER — TRAZODONE HCL 50 MG PO TABS
50.0000 mg | ORAL_TABLET | Freq: Every evening | ORAL | Status: DC | PRN
  Administered 2016-02-07 – 2016-02-09 (×4): 50 mg via ORAL
  Filled 2016-02-06: qty 1
  Filled 2016-02-06: qty 7
  Filled 2016-02-06: qty 1
  Filled 2016-02-06: qty 7
  Filled 2016-02-06: qty 1

## 2016-02-06 MED ORDER — DIPHENHYDRAMINE HCL 50 MG/ML IJ SOLN
50.0000 mg | Freq: Once | INTRAMUSCULAR | Status: AC
  Administered 2016-02-06: 50 mg via INTRAMUSCULAR
  Filled 2016-02-06 (×2): qty 1

## 2016-02-06 MED ORDER — ADULT MULTIVITAMIN W/MINERALS CH
1.0000 | ORAL_TABLET | Freq: Every day | ORAL | Status: DC
  Administered 2016-02-06 – 2016-02-10 (×5): 1 via ORAL
  Filled 2016-02-06 (×8): qty 1

## 2016-02-06 MED ORDER — LORAZEPAM 1 MG PO TABS
1.0000 mg | ORAL_TABLET | Freq: Four times a day (QID) | ORAL | Status: AC
  Administered 2016-02-06 – 2016-02-07 (×5): 1 mg via ORAL
  Filled 2016-02-06 (×5): qty 1

## 2016-02-06 MED ORDER — ZIPRASIDONE MESYLATE 20 MG IM SOLR
20.0000 mg | Freq: Once | INTRAMUSCULAR | Status: AC
  Administered 2016-02-06: 20 mg via INTRAMUSCULAR
  Filled 2016-02-06 (×2): qty 20

## 2016-02-06 MED ORDER — MAGNESIUM HYDROXIDE 400 MG/5ML PO SUSP: 30.0000 mL | Freq: Every day | ORAL | Status: DC | PRN

## 2016-02-06 MED ORDER — NICOTINE POLACRILEX 2 MG MT GUM
CHEWING_GUM | OROMUCOSAL | Status: AC
  Filled 2016-02-06: qty 1

## 2016-02-06 NOTE — Progress Notes (Signed)
D: Patient on detox protocol.  Patient states ativan will not help him.  Patient states, "I was taking everything.  Nothing's going to help me.  I'm really agitated."  Administered scheduled ativan and prn zyprexa.  Patient also given nicorette gum.  Patient upset because he is here.  Patient denies SI/HI/AVH.   A: Continue to monitor medication management and MD orders.  Safety checks completed every 15 minutes per protocol.  Offer support and encouragement as needed. R: Patient is receptive; his behavior has been appropriate.

## 2016-02-06 NOTE — Progress Notes (Signed)
[facility], attending Dr. Dub MikesLugo, report # for RN is [phone]. [facility]. Pt under IVC per record.  Ilean SkillMeghan Johni Narine, MSW, LCSW Clinical Social Work, Disposition  02/06/2016 [phone]
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
## 2016-02-06 NOTE — ED Notes (Signed)
Resting quietly eyes closed with even, unlabored respirations 

## 2016-02-06 NOTE — Tx Team (Signed)
Initial Interdisciplinary Treatment Plan   PATIENT STRESSORS: Financial difficulties Legal issue Marital or family conflict Occupational concerns Substance abuse   PATIENT STRENGTHS: Average or above average intelligence Motivation for treatment/growth Physical Health Supportive family/friends   PROBLEM LIST: Problem List/Patient Goals Date to be addressed Date deferred Reason deferred Estimated date of resolution  At risk for suicide 02/06/2016  02/06/2016   D/C  Aggression 02/06/2016  02/06/2016   D/C  Substance Abuse  02/06/2016  02/06/2016   D/C  Depression 02/06/2016  02/06/2016   D/C  "Stop thinking about things situations over and over again that make me mad or get me upset". 02/06/2016  02/06/2016\  D/C                           DISCHARGE CRITERIA:  Ability to meet basic life and health needs Improved stabilization in mood, thinking, and/or behavior Motivation to continue treatment in a less acute level of care Need for constant or close observation no longer present Safe-care adequate arrangements made Withdrawal symptoms are absent or subacute and managed without 24-hour nursing intervention  PRELIMINARY DISCHARGE PLAN: Attend 12-step recovery group Outpatient therapy Return to previous living arrangement  PATIENT/FAMIILY INVOLVEMENT: This treatment plan has been presented to and reviewed with the patient, Jeremy Callahan.  The patient and family have been given the opportunity to ask questions and make suggestions.  Jeremy Callahan, Jeremy Callahan 02/06/2016, 5:19 PM

## 2016-02-07 MED ORDER — GABAPENTIN 100 MG PO CAPS
100.0000 mg | ORAL_CAPSULE | Freq: Three times a day (TID) | ORAL | Status: DC
  Administered 2016-02-07 – 2016-02-08 (×4): 100 mg via ORAL
  Filled 2016-02-07 (×8): qty 1

## 2016-02-07 NOTE — Progress Notes (Signed)
Pt was very agitated and irritable at the beginning of the shift.  He reports that the medications that he has been given for the last three days are not working, and that he has not talked face to face with a doctor yet.  He is cursing and yelling, crying at times.  He says he wants help, but if we can't give him something to help him, he demands to leave.  Writer spent time talking with pt to no avail.   Pt was insistent on talking with a MD.  Writer asked PA on the unit to speak with the pt.  Medication options were discussed and pt agreed to receive Geodon 20 mg and Benadryl 50 mg IM. Meds were given without incident.   Pt denies SI/HI/AVH this evening.   Writer spent some time talking with the patient after the injections were given and that seemed to help the patient calm down even more.  He went to the dayroom for a little while and then went to bed around 2100.  He appeared to be asleep by 2115.  Support and encouragement offered.  Discharge plans are in process.  Safety maintained with q15 minute checks.

## 2016-02-07 NOTE — Progress Notes (Signed)
The patient did not attend last evening's A.A. Meeting and remained in his bedroom.

## 2016-02-07 NOTE — [facility] Group Notes (Signed)
[facility] LCSW Group Therapy 02/07/2016 1:15 PM Type of Therapy: Group Therapy Participation Level: Active  Participation Quality: Attentive, Sharing and Supportive  Affect: Depressed and Flat  Cognitive: Alert and Oriented  Insight: Developing/Improving and Engaged  Engagement in Therapy: Developing/Improving and Engaged  Modes of Intervention: Activity, Clarification, Confrontation, Discussion, Education, Exploration, Limit-setting, Orientation, Problem-solving, Rapport Building, Reality Testing, Socialization and Support  Summary of Progress/Problems: Patient was attentive and engaged with speaker from [employer]. Patient was attentive to speaker while they shared their story of dealing with mental health and overcoming it. Patient expressed interest in their programs and services and received information on their agency. Patient processed ways they can relate to the speaker.   Masaji Billups, LCSW Clinical Social Worker [facility] [phone]   

## 2016-02-07 NOTE — Plan of Care (Signed)
Problem: Coping: Goal: Ability to cope will improve Outcome: Progressing Nurse discussed depression/coping skills with patient.    

## 2016-02-07 NOTE — [facility] Group Notes (Signed)
The focus of this group is to educate the patient on the purpose and policies of crisis stabilization and provide a format to answer questions about their admission.  The group details unit policies and expectations of patients while admitted.  Patient did not attend 0900 nurse education orientation group this morning.  Patient stayed in bed.   

## 2016-02-07 NOTE — Progress Notes (Signed)
Patient stated he is feeling better this afternoon and would like to be discharged asap.
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
## 2016-02-07 NOTE — Progress Notes (Signed)
Pt reports he slept well.  He thanked Clinical research associatewriter for help in getting the medication so that he could sleep.

## 2016-02-07 NOTE — Tx Team (Signed)
Interdisciplinary Treatment Plan Update (Adult) Date: 02/07/2016    Time Reviewed: 9:30 AM  Progress in Treatment: Attending groups: Continuing to assess, patient new to milieu Participating in groups: Continuing to assess, patient new to milieu Taking medication as prescribed: Yes Tolerating medication: Yes Family/Significant other contact made: No, CSW assessing for appropriate contacts Patient understands diagnosis: Yes Discussing patient identified problems/goals with staff: Yes Medical problems stabilized or resolved: Yes Denies suicidal/homicidal ideation: Yes Issues/concerns per patient self-inventory: Yes Other:  New problem(s) identified: N/A  Discharge Plan or Barriers: CSW continuing to assess, patient new to milieu.  Reason for Continuation of Hospitalization:  Depression Anxiety Medication Stabilization   Comments: N/A  Estimated length of stay: 3-5 days    Patient is a 40 year old male 40 y.o. male brought in by a [facility] Deputy due to threatening to kill himself and having his hand on the gun in the home. Pt reported that he was trying to kill himself. Stressors include: while he was incarcerated his wife lost custody of his son and his bother has custody but does not allow him to see him. Pt's UDS is positive for cocaine and benzodiazepines. Patient will benefit from crisis stabilization, medication evaluation, group therapy and psycho education in addition to case management for discharge planning. At discharge, it is recommended that Pt remain compliant with established discharge plan and continued treatment.   Review of initial/current patient goals per problem list:  1. Goal(s): Patient will participate in aftercare plan   Met: No   Target date: 3-5 days post admission date   As evidenced by: Patient will participate within aftercare plan AEB aftercare provider and housing plan at discharge being identified.  5/16: Goal not met: CSW  assessing for appropriate referrals for pt and will have follow up secured prior to d/c.    2. Goal (s): Patient will exhibit decreased depressive symptoms and suicidal ideations.   Met: No   Target date: 3-5 days post admission date   As evidenced by: Patient will utilize self rating of depression at 3 or below and demonstrate decreased signs of depression or be deemed stable for discharge by MD.  5/16: Goal not met: Pt presents with flat affect and depressed mood.  Pt admitted with depression rating of 10.  Pt to show decreased sign of depression and a rating of 3 or less before d/c.      3. Goal(s): Patient will demonstrate decreased signs and symptoms of anxiety.   Met: No   Target date: 3-5 days post admission date   As evidenced by: Patient will utilize self rating of anxiety at 3 or below and demonstrated decreased signs of anxiety, or be deemed stable for discharge by MD  5/16: Goal not met: Pt presents with anxious mood and affect.  Pt admitted with anxiety rating of 10.  Pt to show decreased sign of anxiety and a rating of 3 or less before d/c.     4. Goal(s): Patient will demonstrate decreased signs of withdrawal due to substance abuse   Met: No   Target date: 3-5 days post admission date   As evidenced by: Patient will produce a CIWA/COWS score of 0, have stable vitals signs, and no symptoms of withdrawal  5/16: Goal not met: Pt continues to have withdrawal symptoms of agitation, anxiety and a CIWA score of a 4/COWS score of 3.  Pt to show decrease withdrawal symptoms prior to d/c.   Attendees: Patient:    Family:  Physician: Dr. Parke Poisson; Dr. Einar Grad  02/07/2016 9:30 AM  Nursing: Grayland Ormond, RN 02/07/2016 9:30 AM  Clinical Social Worker: Erasmo Downer Bradie Lacock, LCSW 02/07/2016 9:30 AM  Other: Peri Maris, LCSWA; Heather Smart, LCSW  02/07/2016 9:30 AM  Other: Norberto Sorenson, P4CC 02/07/2016 9:30 AM  Other: Lars Pinks, Case Manager 02/07/2016 9:30 AM  Other:  Agustina Caroli, May Augustin, NP 02/07/2016 9:30 AM  Other:      Scribe for Treatment Team:  Tilden Fossa, [place]

## 2016-02-07 NOTE — Progress Notes (Signed)
Recreation Therapy Notes  Animal-Assisted Activity (AAA) Program Checklist/Progress Notes Patient Eligibility Criteria Checklist & Daily Group note for Rec Tx Intervention  Date: 05.15.2017 Time: 2:45pm Location: [address] Dayroom    AAA/T Program Assumption of Risk Form signed by Patient/ or Parent Legal Guardian Yes  Patient is free of allergies or sever asthma Yes  Patient reports no fear of animals Yes  Patient reports no history of cruelty to animals Yes  Patient understands his/her participation is voluntary Yes  Patient washes hands before animal contact Yes  Patient washes hands after animal contact Yes  Behavioral Response: Engaged, Attentive   Education: Hand Washing, Appropriate Animal Interaction   Education Outcome: Acknowledges education.   Clinical Observations/Feedback: Patient interacted appropriately with therapy dog and peers during session.    Jeremy Callahan L Jeremy Callahan, LRT/CTRS        Keevan Wolz L 02/07/2016 3:00 PM 
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
## 2016-02-07 NOTE — BHH Suicide Risk Assessment (Signed)
[facility] Admission Suicide Risk Assessment   Nursing information obtained from:  Patient Demographic factors:  Male, Divorced or widowed, Caucasian, Low socioeconomic status, Unemployed, Access to firearms Current Mental Status:  Suicidal ideation indicated by patient, Self-harm thoughts, Self-harm behaviors Loss Factors:  Loss of significant relationship, Decline in physical health, Legal issues Historical Factors:  NA Risk Reduction Factors:  Responsible for children under 26 years of age, Living with another person, especially a relative  Total Time spent with patient: 1 hour Principal Problem: <principal problem not specified> Diagnosis:   Patient Active Problem List   Diagnosis Date Noted  . Substance induced mood disorder [facility]) [F19.94] 02/06/2016   Subjective Data: Patient is a 40 year old Caucasian male admitted with suicidal plan to shoot himself in the head and with polysubstance abuse.  Continued Clinical Symptoms:    The "Alcohol Use Disorders Identification Test", Guidelines for Use in Primary Care, Second Edition.  World Science writer [facility]). Score between 0-7:  no or low risk or alcohol related problems. Score between 8-15:  moderate risk of alcohol related problems. Score between 16-19:  high risk of alcohol related problems. Score 20 or above:  warrants further diagnostic evaluation for alcohol dependence and treatment.   CLINICAL FACTORS:   Depression:   Comorbid alcohol abuse/dependence Hopelessness Impulsivity Insomnia Severe   Musculoskeletal: Strength & Muscle Tone: within normal limits Gait & Station: normal Patient leans: N/A  Psychiatric Specialty Exam: ROS  Blood pressure 101/62, pulse 100, temperature 98.4 F (36.9 C), temperature source Oral, resp. rate 18, height  (1.676 m), weight 143 lb 4 oz (64.978 kg).Body mass index is 23.13 kg/(m^2).   General Appearance: Disheveled  Eye Contact:: Minimal  Speech: Garbled  Volume:  Increased  Mood: Anxious, Depressed, Dysphoric, Hopeless and Irritable  Affect: Depressed, Labile and Tearful  Thought Process: Circumstantial  Orientation: Full (Time, Place, and Person)  Thought Content: Rumination  Suicidal Thoughts: Yes. with intent/plan  Homicidal Thoughts: No  Memory: Immediate; Fair Recent; Fair Remote; Fair  Judgement: Impaired  Insight: Shallow  Psychomotor Activity: Normal  Concentration: Fair  Recall: [employer] of Knowledge:Fair  Language: Fair  Akathisia: No  Handed: Right  AIMS (if indicated):    Assets: Desire for Improvement  ADL's: Intact  Cognition: WNL  Sleep: Number of Hours: 6.5                         COGNITIVE FEATURES THAT CONTRIBUTE TO RISK:  Closed-mindedness and Thought constriction (tunnel vision)    SUICIDE RISK:   Moderate:  Frequent suicidal ideation with limited intensity, and duration, some specificity in terms of plans, no associated intent, good self-control, limited dysphoria/symptomatology, some risk factors present, and identifiable protective factors, including available and accessible social support.  PLAN OF CARE:   Treatment Plan Summary: Daily contact with patient to assess and evaluate symptoms and progress in treatment and Medication management  Observation Level/Precautions: 15 minute checks  Laboratory: CBC Chemistry Profile HbAIC UDS  Psychotherapy: Patient to engage in group therapy to develop coping skills to deal with his stressful situations and also substance abuse therapy with AA meetings , patient to develop alternative action plans to suicidal thoughts.  Medications: Patient on detox protocol for alcohol with the Ativan and will be started on gabapentin at 100 mg 3 times daily anxiety   Consultations: As Needed   Discharge Concerns: Safety and stabilization   Estimated LOS: 5 to 6 days   Other:  I certify that  inpatient services furnished can reasonably be expected to improve the patient's condition.   Patrick NorthAVI, Jaystin Mcgarvey, MD 02/07/2016, 12:16 PM
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
## 2016-02-08 DIAGNOSIS — R45851 Suicidal ideations: Secondary | ICD-10-CM

## 2016-02-08 DIAGNOSIS — F1994 Other psychoactive substance use, unspecified with psychoactive substance-induced mood disorder: Secondary | ICD-10-CM

## 2016-02-08 MED ORDER — GABAPENTIN 100 MG PO CAPS
200.0000 mg | ORAL_CAPSULE | Freq: Three times a day (TID) | ORAL | Status: DC
  Administered 2016-02-08 – 2016-02-10 (×6): 200 mg via ORAL
  Filled 2016-02-08: qty 42
  Filled 2016-02-08 (×2): qty 2
  Filled 2016-02-08 (×2): qty 42
  Filled 2016-02-08 (×5): qty 2
  Filled 2016-02-08: qty 42
  Filled 2016-02-08: qty 2
  Filled 2016-02-08: qty 42
  Filled 2016-02-08: qty 2
  Filled 2016-02-08: qty 42

## 2016-02-08 MED ORDER — SERTRALINE HCL 25 MG PO TABS
25.0000 mg | ORAL_TABLET | Freq: Every day | ORAL | Status: DC
  Administered 2016-02-08 – 2016-02-09 (×2): 25 mg via ORAL
  Filled 2016-02-08 (×6): qty 1

## 2016-02-08 NOTE — Progress Notes (Signed)
[facility] MD Progress Note  02/08/2016 3:28 PM Jeremy Callahan  MRN:  [id number] Subjective:  Patent states tearfully, "my son came to see me last night.  I really just want to get my relationship back with him.  I'm through with all the bull."  Endorses depression but denies SI.   Objective:  Patient expressed that he just wanted to get his life back and repair broken family ties.  He is willing to get the help.  He states that he is depressed and that he tended to isolate from family members.   Discussed medications with him.   Principal Problem: Substance induced mood disorder (HCC) Diagnosis:   Patient Active Problem List   Diagnosis Date Noted  . Substance induced mood disorder [facility]) [F19.94] 02/06/2016    Priority: High   Total Time spent with patient: 30 minutes  Past Psychiatric History: see above noted  Past Medical History:  Past Medical History  Diagnosis Date  . Anxiety    History reviewed. No pertinent past surgical history. Family History: History reviewed. No pertinent family history. Family Psychiatric  History:  Denied Social History:  History  Alcohol Use  . 3.6 - 7.2 oz/week  . 6-12 Cans of beer per week    Comment: 6-12 cans a day for the last 2 months.     History  Drug Use  . Yes  . Special: Cocaine, IV, Marijuana, Methamphetamines    Comment: heroin    Social History   Social History  . Marital Status: Married    Spouse Name: N/A  . Number of Children: N/A  . Years of Education: N/A   Social History Main Topics  . Smoking status: Current Every Day Smoker    Types: Cigarettes  . Smokeless tobacco: None  . Alcohol Use: 3.6 - 7.2 oz/week    6-12 Cans of beer per week     Comment: 6-12 cans a day for the last 2 months.  . Drug Use: Yes    Special: Cocaine, IV, Marijuana, Methamphetamines     Comment: heroin  . Sexual Activity: Not Asked   Other Topics Concern  . None   Social History Narrative   Additional Social History:     Sleep:  Poor  Appetite:  Good  Current Medications: Current Facility-Administered Medications  Medication Dose Route Frequency Provider Last Rate Last Dose  . acetaminophen (TYLENOL) tablet 650 mg  650 mg Oral Q6H PRN Thermon Leyland, NP      . alum & mag hydroxide-simeth (MAALOX/MYLANTA) 200-200-20 MG/5ML suspension 30 mL  30 mL Oral Q4H PRN Thermon Leyland, NP      . gabapentin (NEURONTIN) capsule 100 mg  100 mg Oral TID Himabindu Ravi, MD   100 mg at 02/08/16 1207  . hydrOXYzine (ATARAX/VISTARIL) tablet 25 mg  25 mg Oral Q6H PRN Thermon Leyland, NP   25 mg at 02/07/16 1431  . loperamide (IMODIUM) capsule 2-4 mg  2-4 mg Oral PRN Thermon Leyland, NP      . LORazepam (ATIVAN) tablet 1 mg  1 mg Oral Q6H PRN Thermon Leyland, NP   1 mg at 02/07/16 1856  . LORazepam (ATIVAN) tablet 1 mg  1 mg Oral TID Thermon Leyland, NP   1 mg at 02/08/16 1207   Followed by  . [START ON 02/09/2016] LORazepam (ATIVAN) tablet 1 mg  1 mg Oral BID Thermon Leyland, NP       Followed by  . [START  ON 02/10/2016] LORazepam (ATIVAN) tablet 1 mg  1 mg Oral Daily Thermon LeylandLaura A Davis, NP      . magnesium hydroxide (MILK OF MAGNESIA) suspension 30 mL  30 mL Oral Daily PRN Thermon LeylandLaura A Davis, NP      . multivitamin with minerals tablet 1 tablet  1 tablet Oral Daily Thermon LeylandLaura A Davis, NP   1 tablet at 02/08/16 0752  . nicotine polacrilex (NICORETTE) gum 2 mg  2 mg Oral PRN Himabindu Ravi, MD   2 mg at 02/08/16 1518  . ondansetron (ZOFRAN-ODT) disintegrating tablet 4 mg  4 mg Oral Q6H PRN Thermon LeylandLaura A Davis, NP      . thiamine (VITAMIN B-1) tablet 100 mg  100 mg Oral Daily Thermon LeylandLaura A Davis, NP   100 mg at 02/08/16 0752  . traZODone (DESYREL) tablet 50 mg  50 mg Oral QHS PRN Thermon LeylandLaura A Davis, NP   50 mg at 02/07/16 2117    Lab Results: No results found for this or any previous visit (from the past 48 hour(s)).  Blood Alcohol level:  Lab Results  Component Value Date   [facility]    Physical Findings: AIMS: Facial and Oral Movements Muscles of Facial  Expression: None, normal Lips and Perioral Area: None, normal Jaw: None, normal Tongue: None, normal,Extremity Movements Upper (arms, wrists, hands, fingers): None, normal Lower (legs, knees, ankles, toes): None, normal, Trunk Movements Neck, shoulders, hips: None, normal, Overall Severity Severity of abnormal movements (highest score from questions above): None, normal Incapacitation due to abnormal movements: None, normal Patient's awareness of abnormal movements (rate only patient's report): No Awareness, Dental Status Current problems with teeth and/or dentures?: No Does patient usually wear dentures?: No  CIWA:  CIWA-Ar Total: 3 COWS:  COWS Total Score: 3  Musculoskeletal: Strength & Muscle Tone: within normal limits Gait & Station: normal Patient leans: N/A  Psychiatric Specialty Exam: Review of Systems  Psychiatric/Behavioral: Positive for depression. Negative for suicidal ideas and substance abuse. The patient is nervous/anxious.   All other systems reviewed and are negative.   Blood pressure 108/71, pulse 90, temperature 99.2 F (37.3 C), temperature source Oral, resp. rate 18, height 5\' 6"  (1.676 m), weight 64.978 kg (143 lb 4 oz).Body mass index is 23.13 kg/(m^2).   General Appearance: Disheveled  Eye Contact:: Minimal  Speech: Garbled  Volume: Increased  Mood: Anxious, Depressed, Dysphoric, Hopeless and Irritable  Affect: Depressed, Labile and Tearful  Thought Process: Circumstantial  Orientation: Full (Time, Place, and Person)  Thought Content: Rumination  Suicidal Thoughts: Yes. with intent/plan  Homicidal Thoughts: No  Memory: Immediate; Fair Recent; Fair Remote; Fair  Judgement: Impaired  Insight: Shallow  Psychomotor Activity: Normal  Concentration: Fair  Recall: [employer]  Fund of Knowledge:Fair  Language: Fair  Akathisia: No  Handed: Right  AIMS (if indicated):    Assets: Desire for Improvement   ADL's: Intact  Cognition: WNL  Sleep: Number of Hours: 6.5       Treatment Plan Summary: Admit for crisis management and mood stabilization. Medication management to re-stabilize current mood symptoms - Patient on detox protocol for alcohol with the Ativan, Increased Gabapentin at 200 mg 3 times daily anxiety.  Added Zoloft 25 mg daily. Group counseling sessions for coping skills Medical consults as needed Review and reinstate any pertinent home medications for other health problems  Lindwood QuaSheila May Neal Trulson, NP 02/08/2016, 3:28 PM

## 2016-02-08 NOTE — [facility] Group Notes (Signed)
[facility] LCSW Group Therapy  02/08/2016   1:15 PM   Type of Therapy:  Group Therapy  Participation Level:  Active  Participation Quality:  Attentive, Sharing and Supportive  Affect:  Depressed and Flat  Cognitive:  Alert and Oriented  Insight:  Developing/Improving and Engaged  Engagement in Therapy:  Developing/Improving and Engaged  Modes of Intervention:  Clarification, Confrontation, Discussion, Education, Exploration, Limit-setting, Orientation, Problem-solving, Rapport Building, Dance movement psychotherapisteality Testing, Socialization and Support  Summary of Progress/Problems: The topic for group therapy was feelings about diagnosis.  Pt actively participated in group discussion on their past and current diagnosis and how they feel towards this.  Pt also identified how society and family members judge them, based on their diagnosis as well as stereotypes and stigmas.  Patient discussed losing jobs, housing, his marriage, and relationships with family as a result of his drug use. Patient appears to be in a contemplative stage about change as he is aware that his addiction use is problematic but also minimizes his need for treatment and commitment level to change.   Jeremy Callahan, MSW, LCSW Clinical Social Worker [facility] [phone]

## 2016-02-08 NOTE — Progress Notes (Signed)
[facility] Group Notes:  (Nursing/MHT/Case Management/Adjunct)  Date:  02/08/2016  Time:  3:40 PM  Type of Therapy:  Psychoeducational Skills  Participation Level:  Did Not Attend  Participation Quality:  did not attend  Affect:  did not attend  Cognitive:  did not attend  Insight:  None  Engagement in Group:  did not attend  Modes of Intervention:  did not attend  Summary of Progress/Problems: Pt did not attend group  Karleen HampshireFox, Amaan Meyer Brittini 02/08/2016, 3:40 PM

## 2016-02-08 NOTE — B[facility] Group Notes (Signed)
[facility] LCSW Aftercare Discharge Planning Group Note  02/08/2016  8:45 AM  Participation Quality: Did Not Attend. Patient invited to participate but declined.  Morene Cecilio, MSW, LCSW Clinical Social Worker [facility] [phone]   

## 2016-02-08 NOTE — [facility] Suicide Risk Assessment (Signed)
[facility] INPATIENT:  Family/Significant Other Suicide Prevention Education  Suicide Prevention Education:  Patient Refusal for Family/Significant Other Suicide Prevention Education: The patient Jeremy Callahan has refused to provide written consent for family/significant other to be provided Family/Significant Other Suicide Prevention Education during admission and/or prior to discharge.  Physician notified. SPE reviewed with patient and brochure provided. Patient encouraged to return to hospital if having suicidal thoughts, patient verbalized his/her understanding and has no further questions at this time.   Avanti Jetter, West CarboKristin L 02/08/2016, 11:41 AM

## 2016-02-08 NOTE — Plan of Care (Signed)
Problem: Medication: Goal: Compliance with prescribed medication regimen will improve Outcome: Progressing Pt compliant with medication regime   

## 2016-02-08 NOTE — B[facility] Group Notes (Signed)
Patient was inapproiate touch another patient. Nurse was notify.

## 2016-02-08 NOTE — [facility] Group Notes (Signed)
Patient attend group his day was 8 he working with the doctors to meet his goal.

## 2016-02-08 NOTE — Progress Notes (Signed)
Patient ID: Jeremy Callahan, male   DOB: 03/21/1976, 40 y.o.   MRN: [id number] D: Client visible on the unit, reports of day "great" "changed my medication, feel a lot better" A:Writer provided emotional support. Medications reviewed, administered as ordered. Staff will monitor q3015min for safety. R: Client is safe on the unit, attended group.

## 2016-02-08 NOTE — B[facility] Group Notes (Signed)
Patient did not attend group.
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
## 2016-02-08 NOTE — Progress Notes (Signed)
Recreation Therapy Notes  Date: 05.17.2017 Time: 9:30am Location: [address] Group Room   Group Topic: Stress Management  Goal Area(s) Addresses:  Patient will actively participate in stress management techniques presented during session.   Behavioral Response: Did not attend.   Tyah Acord L Rogene Meth, LRT/CTRS        Momin Misko L 02/08/2016 2:13 PM 

## 2016-02-09 DIAGNOSIS — F1920 Other psychoactive substance dependence, uncomplicated: Secondary | ICD-10-CM | POA: Diagnosis present

## 2016-02-09 MED ORDER — SERTRALINE HCL 50 MG PO TABS
50.0000 mg | ORAL_TABLET | Freq: Every day | ORAL | Status: DC
  Administered 2016-02-10: 50 mg via ORAL
  Filled 2016-02-09 (×2): qty 7
  Filled 2016-02-09 (×2): qty 1

## 2016-02-09 MED ORDER — SERTRALINE HCL 25 MG PO TABS
25.0000 mg | ORAL_TABLET | Freq: Once | ORAL | Status: AC
  Administered 2016-02-09: 25 mg via ORAL
  Filled 2016-02-09 (×2): qty 1

## 2016-02-09 NOTE — [facility] Group Notes (Signed)
[facility] LCSW Group Therapy  02/09/2016 4:03 PM  Type of Therapy:  Group Therapy  Participation Level:  Minimal  Participation Quality:  Attentive  Affect:  Blunted  Cognitive:  Appropriate  Insight:  Limited  Engagement in Therapy:  Limited  Modes of Intervention:  Confrontation, Discussion, Education, Exploration, Problem-solving, Rapport Building, Socialization and Support  Summary of Progress/Problems: Feelings around Relapse. Group members discussed the meaning of relapse and shared personal stories of relapse, how it affected them and others, and how they perceived themselves during this time. Group members were encouraged to identify triggers, warning signs and coping skills used when facing the possibility of relapse. Social supports were discussed and explored in detail. Post Acute Withdrawal Syndrome (handout provided) was introduced and examined. Pt's were encouraged to ask questions, talk about key points associated with PAWS, and process this information in terms of relapse prevention. Jomarie LongsJoseph was attentive during group but did not actively engage in group discussion. He followed along as CSW and other group members read and processed the PAWS handout.   Smart, Itzelle Gains LCSW 02/09/2016, 4:03 PM

## 2016-02-09 NOTE — Progress Notes (Signed)
Patient ID: Jeremy Callahan, male   DOB: [birth year], 40 y.o.   MRN: [id number] [facility] MD Progress Note  02/09/2016 3:05 PM ZYLEN WENIG  MRN:  [id number]  Subjective:  It is going really well today. I feel like myself again. I would like be considered for a discharge tomorrow. My depression has improved a lot"  Objective:  Patient expressed that he just wanted to get his life back and repair his broken family ties.  He is willing to get the help.  He states that he is depressed and that he tended to isolate from family members. Says coming in here was an eye opener. To see others going something that are not very pleasant makes me realize that I am not in this world alone. Everyone has one thing or the other on their sleeve. I believe that I'm ready to go home, hopefully tomorrow. I need to get back to working again. Mazen denies any SIHI, AVH, delusional thoughts or paranoia.  Principal Problem: Substance induced mood disorder (HCC)  Diagnosis:   Patient Active Problem List   Diagnosis Date Noted  . Substance induced mood disorder (HCC) [F19.94] 02/06/2016   Total Time spent with patient: 15 minutes  Past Psychiatric History: see above noted  Past Medical History:  Past Medical History  Diagnosis Date  . Anxiety    History reviewed. No pertinent past surgical history. Family History: History reviewed. No pertinent family history. Family Psychiatric  History:  Denied Social History:  History  Alcohol Use  . 3.6 - 7.2 oz/week  . 6-12 Cans of beer per week    Comment: 6-12 cans a day for the last 2 months.     History  Drug Use  . Yes  . Special: Cocaine, IV, Marijuana, Methamphetamines    Comment: heroin    Social History   Social History  . Marital Status: Married    Spouse Name: N/A  . Number of Children: N/A  . Years of Education: N/A   Social History Main Topics  . Smoking status: Current Every Day Smoker    Types: Cigarettes  . Smokeless tobacco: None  .  Alcohol Use: 3.6 - 7.2 oz/week    6-12 Cans of beer per week     Comment: 6-12 cans a day for the last 2 months.  . Drug Use: Yes    Special: Cocaine, IV, Marijuana, Methamphetamines     Comment: heroin  . Sexual Activity: Not Asked   Other Topics Concern  . None   Social History Narrative   Additional Social History:   Sleep: Good  Appetite:  Good  Current Medications: Current Facility-Administered Medications  Medication Dose Route Frequency Provider Last Rate Last Dose  . acetaminophen (TYLENOL) tablet 650 mg  650 mg Oral Q6H PRN Thermon Leyland, NP      . alum & mag hydroxide-simeth (MAALOX/MYLANTA) 200-200-20 MG/5ML suspension 30 mL  30 mL Oral Q4H PRN Thermon Leyland, NP      . gabapentin (NEURONTIN) capsule 200 mg  200 mg Oral TID Adonis Brook, NP   200 mg at 02/09/16 1152  . LORazepam (ATIVAN) tablet 1 mg  1 mg Oral BID Thermon Leyland, NP   1 mg at 02/09/16 0740   Followed by  . [START ON 02/10/2016] LORazepam (ATIVAN) tablet 1 mg  1 mg Oral Daily Thermon Leyland, NP      . magnesium hydroxide (MILK OF MAGNESIA) suspension 30 mL  30 mL Oral Daily  PRN Thermon LeylandLaura A Davis, NP      . multivitamin with minerals tablet 1 tablet  1 tablet Oral Daily Thermon LeylandLaura A Davis, NP   1 tablet at 02/09/16 0741  . nicotine polacrilex (NICORETTE) gum 2 mg  2 mg Oral PRN Himabindu Ravi, MD   2 mg at 02/09/16 1057  . sertraline (ZOLOFT) tablet 25 mg  25 mg Oral Daily Adonis BrookSheila Agustin, NP   25 mg at 02/09/16 0740  . thiamine (VITAMIN B-1) tablet 100 mg  100 mg Oral Daily Thermon LeylandLaura A Davis, NP   100 mg at 02/09/16 0741  . traZODone (DESYREL) tablet 50 mg  50 mg Oral QHS PRN Thermon LeylandLaura A Davis, NP   50 mg at 02/08/16 2101   Lab Results: No results found for this or any previous visit (from the past 48 hour(s)).  Blood Alcohol level:  Lab Results  Component Value Date   [facility] 02[facility]   Physical Findings: AIMS: Facial and Oral Movements Muscles of Facial Expression: None, normal Lips and Perioral Area: None,  normal Jaw: None, normal Tongue: None, normal,Extremity Movements Upper (arms, wrists, hands, fingers): None, normal Lower (legs, knees, ankles, toes): None, normal, Trunk Movements Neck, shoulders, hips: None, normal, Overall Severity Severity of abnormal movements (highest score from questions above): None, normal Incapacitation due to abnormal movements: None, normal Patient's awareness of abnormal movements (rate only patient's report): No Awareness, Dental Status Current problems with teeth and/or dentures?: No Does patient usually wear dentures?: No  CIWA:  CIWA-Ar Total: 0 COWS:  COWS Total Score: 3  Musculoskeletal: Strength & Muscle Tone: within normal limits Gait & Station: normal Patient leans: N/A  Psychiatric Specialty Exam: Review of Systems  Psychiatric/Behavioral: Positive for depression. Negative for suicidal ideas and substance abuse. The patient is nervous/anxious.   All other systems reviewed and are negative.   Blood pressure 111/64, pulse 98, temperature 98.8 F (37.1 C), temperature source Oral, resp. rate 18, height 5\' 6"  (1.676 m), weight 64.978 kg (143 lb 4 oz).Body mass index is 23.13 kg/(m^2).   General Appearance: Disheveled  Eye Contact:: Minimal  Speech: Garbled  Volume: Increased  Mood: Anxious, Depressed, Dysphoric, Hopeless and Irritable  Affect: Depressed, Labile and Tearful  Thought Process: Circumstantial  Orientation: Full (Time, Place, and Person)  Thought Content: Rumination  Suicidal Thoughts: Yes. with intent/plan  Homicidal Thoughts: No  Memory: Immediate; Fair Recent; Fair Remote; Fair  Judgement: Impaired  Insight: Shallow  Psychomotor Activity: Normal  Concentration: Fair  Recall: [employer]Fair  Fund of Knowledge:Fair  Language: Fair  Akathisia: No  Handed: Right  AIMS (if indicated):    Assets: Desire for Improvement  ADL's: Intact  Cognition: WNL  Sleep: Number of Hours:  6.5       Plan: Treatment Plan Summary: Daily contact with patient to assess and evaluate symptoms and progress in treatment Medication management  Plan: Supportive approach/coping skills/relapse prevention Depression: Continue Sertraline 25 mg. Insomnia: continue Trazodone 50 mg.  Continue to work with mindfulness, CBT help  identify the cognitive distortions that keep the depression/substance abuse going. Discuss other life style changes that can help with both his depression and his alcohol use such like exercise, meditation Will use motivational interviewing and encourage medication management & counseling services while inpatient & after discharge. Continue to educate on the effects of substance abuse and other recovery strategies. Social worker to work discharge disposition.  Armandina StammerNwoko, Agnes I, NP, PMHNP-BC 02/09/2016, 3:05 PM Agree with NP progress note as above

## 2016-02-09 NOTE — Progress Notes (Signed)
Patient ID: Jeremy Callahan, male   DOB: 11/11/1975, 40 y.o.   MRN: [id number]   Adult Psychoeducational Group Note  Date:  02/09/2016 Time: 09:00am   Group Topic/Focus:  Developing a Wellness Toolbox:   The focus of this group is to help patients develop a "wellness toolbox" with skills and strategies to promote recovery upon discharge.  Participation Level: n/a  Participation Quality: n/a  Affect: n/a  Cognitive: n/a  Insight: n/a  Engagement in Group: n/a  Modes of Intervention:  Activity, Discussion, Education and Support  Additional Comments:  Pt did not attend group today, pt in bed asleep.   Aurora Maskwyman, Suly Vukelich E 02/09/2016, 10:00 AM

## 2016-02-09 NOTE — Progress Notes (Signed)
Patient ID: Jeremy DonningJoseph M Callahan, male   DOB: 12/01/1975, 40 y.o.   MRN: [id number] D: Client is visible on the unit, denies anxiety, depression. Reports discharge plans "going to my dad's" "dad don't play around, in church all the time" "won't be going round those same people" Client reports of this admission "awesome experience, talking to people, they giving you help, never had nobody do that before" A: Writer encouraged client to continue with sobriety, initiate plans to change associates to help maintain sobriety. Medication reviewed, administered as ordered. Staff will monitor q6615min for safety. R: Client is safe on the unit. Attended karaoke.

## 2016-02-09 NOTE — Progress Notes (Signed)
D; Patient continues to improve with treatment.  He is less irritable and labile.  He is interacting well with staff and peers.  He states, "I would like to see my son every day, but I can't."  He continues on the ativan protocol and his withdrawal symptoms have decreased.  He is resting quietly in bed.  Patient denies SI/HI/AVH.   A: Continue to monitor medication management and MD orders.  Safety checks completed every 15 minutes per protocol.  Offer support and encouragement as needed. R: Patient is receptive to staff; his behavior has been appropriate.

## 2016-02-10 DIAGNOSIS — F1920 Other psychoactive substance dependence, uncomplicated: Secondary | ICD-10-CM

## 2016-02-10 MED ORDER — GABAPENTIN 100 MG PO CAPS: 200.0000 mg | ORAL_CAPSULE | Freq: Three times a day (TID) | ORAL | Status: AC

## 2016-02-10 MED ORDER — NICOTINE POLACRILEX 2 MG MT GUM: 2.0000 mg | CHEWING_GUM | OROMUCOSAL | Status: AC | PRN

## 2016-02-10 MED ORDER — SERTRALINE HCL 50 MG PO TABS: 50.0000 mg | ORAL_TABLET | Freq: Every day | ORAL | Status: AC

## 2016-02-10 MED ORDER — TRAZODONE HCL 50 MG PO TABS: 50.0000 mg | ORAL_TABLET | Freq: Every evening | ORAL | Status: AC | PRN

## 2016-02-10 NOTE — Progress Notes (Signed)
Patient ID: Jeremy Callahan, male   DOB: 08/27/1976, 40 y.o.   MRN: [id number]  Pt. Denies SI/HI and A/V hallucinations. Belongings returned to patient at time of discharge. Patient denies any pain or discomfort. Discharge instructions and medications were reviewed with patient. Patient verbalized understanding of both medications and discharge instructions. He was discharged to lobby where his brother was there to retrieve him. Q15 minute safety checks maintained until discharge. No distress upon discharge.

## 2016-02-10 NOTE — Progress Notes (Signed)
  [facility] :  Will you be returning to the same living situation after discharge:  Yes,  home with family At discharge, do you have transportation home?: Yes,  "my brother will pick me up after lunch." Do you have the ability to pay for your medications: Yes, mental health  Release of information consent forms completed and submitted to medical records by CSW.  Patient to Follow up at: Follow-up Information    [facility].   Why:  Assessment for therapy and medication management services at 8am on Monday May 22nd. Please bring ID and any proof of income.    Contact information:   [address] [place], [place] [address] Phone: [phone] Fax: [phone]      Next level of care provider [facility] Link:no  Safety Planning and Suicide Prevention discussed: Yes,  SPE completed with pt, as he declined to consent to family contact. SPI pamphlet and mobile crisis information provided to pt and he was encouraged to share information with support network.   Have you used any form of tobacco in the last 30 days? (Cigarettes, Smokeless Tobacco, Cigars, and/or Pipes): Yes  Has patient been referred to the Quitline?: Patient refused referral  Patient has been referred for addiction treatment: Yes  Smart, Denisia Harpole LCSW  02/10/2016, 10:12 AM

## 2016-02-10 NOTE — [facility] Suicide Risk Assessment (Signed)
[facility]   Principal Problem: Polysubstance (excluding opioids) dependence w/o physiol dependence [facility](HCC) Discharge Diagnoses:  Patient Active Problem List   Diagnosis Date Noted  . Polysubstance (excluding opioids) dependence w/o physiol dependence (HCC) [F19.20] 02/09/2016  . Substance induced mood disorder (HCC) [F19.94] 02/06/2016    Total Time spent with patient: 30 minutes  Musculoskeletal: Strength & Muscle Tone: within normal limits Gait & Station: normal Patient leans: N/A  Psychiatric Specialty Exam: Review of Systems  Psychiatric/Behavioral: Positive for substance abuse. Negative for depression and suicidal ideas.  All other systems reviewed and are negative.   Blood pressure 135/69, pulse 88, temperature 98.8 F (37.1 C), temperature source Oral, resp. rate 16, height 5\' 6"  (1.676 m), weight 64.978 kg (143 lb 4 oz).Body mass index is 23.13 kg/(m^2).  General Appearance: Casual  Eye Contact::  Fair  Speech:  Clear and Coherent409  Volume:  Normal  Mood:  Euthymic  Affect:  Congruent  Thought Process:  Goal Directed  Orientation:  Full (Time, Place, and Person)  Thought Content:  WDL  Suicidal Thoughts:  No  Homicidal Thoughts:  No  Memory:  Immediate;   Fair Recent;   Fair Remote;   Fair  Judgement:  Fair  Insight:  Fair  Psychomotor Activity:  Normal  Concentration:  Fair  Recall:  [employer]Fair  Fund of Knowledge:Fair  Language: Fair  Akathisia:  No  Handed:  Right  AIMS (if indicated):     Assets:  Desire for Improvement  Sleep:  Number of Hours: 6  Cognition: WNL  ADL's:  Intact   Mental Status Per Nursing Assessment::   On Admission:  Suicidal ideation indicated by patient, Self-harm thoughts, Self-harm behaviors  Demographic Factors:  Male and Caucasian  Loss Factors: NA  Historical Factors: Impulsivity  Risk Reduction Factors:   Positive social support  Continued Clinical Symptoms:  Previous Psychiatric  Diagnoses and Treatments  Cognitive Features That Contribute To Risk:  None    Suicide Risk:  Minimal: No identifiable suicidal ideation.  Patients presenting with no risk factors but with morbid ruminations; may be classified as minimal risk based on the severity of the depressive symptoms  Follow-up Information    [facility].   Why:  Assessment for therapy and medication management services at 8am on Monday May 22nd. Please bring ID and any proof of income.    Contact information:   [address] [place], [place] [address] Phone: [phone] Fax: [phone]      Plan Of Care/Follow-up recommendations:  Activity:  no restrictions Diet:  regular Tests:  as needed Other:  follow up with aftercare  Tyaira Heward, MD 02/10/2016, 9:25 AM

## 2016-02-10 NOTE — Progress Notes (Signed)
Recreation Therapy Notes  Date: 05.19.2017 Time: 9:30am Location: [address] Group Room   Group Topic: Stress Management  Goal Area(s) Addresses:  Patient will actively participate in stress management techniques presented during session.   Behavioral Response: Did not attend.    Adin Laker L Queena Monrreal, LRT/CTRS        [place] Reliford L 02/10/2016 1:55 PM 

## 2016-02-10 NOTE — Discharge Summary (Signed)
Physician Discharge Summary Note  Patient:  Jeremy Callahan is an 40 y.o., male MRN:  [id number] DOB:  Jun 14, 1976 Patient phone:  [phone] (home)  Patient address:   [address] [place] [address],  Total Time spent with patient: Greater than 30 minutes  Date of Admission:  02/06/2016  Date of Discharge: 02-10-16  Reason for Admission:   Principal Problem: Polysubstance (excluding opioids) dependence w/o physiol dependence [facility])  Discharge Diagnoses: Patient Active Problem List   Diagnosis Date Noted  . Polysubstance (excluding opioids) dependence w/o physiol dependence (HCC) [F19.20] 02/09/2016  . Substance induced mood disorder [facility]) [F19.94] 02/06/2016   Past Psychiatric History: Hx. Opioid use disorder  Past Medical History:  Past Medical History  Diagnosis Date  . Anxiety    History reviewed. No pertinent past surgical history.  Family History: History reviewed. No pertinent family history.  Family Psychiatric  History: See H&P  Social History:  History  Alcohol Use  . 3.6 - 7.2 oz/week  . 6-12 Cans of beer per week    Comment: 6-12 cans a day for the last 2 months.     History  Drug Use  . Yes  . Special: Cocaine, IV, Marijuana, Methamphetamines    Comment: heroin    Social History   Social History  . Marital Status: Married    Spouse Name: N/A  . Number of Children: N/A  . Years of Education: N/A   Social History Main Topics  . Smoking status: Current Every Day Smoker    Types: Cigarettes  . Smokeless tobacco: None  . Alcohol Use: 3.6 - 7.2 oz/week    6-12 Cans of beer per week     Comment: 6-12 cans a day for the last 2 months.  . Drug Use: Yes    Special: Cocaine, IV, Marijuana, Methamphetamines     Comment: heroin  . Sexual Activity: Not Asked   Other Topics Concern  . None   Social History Narrative   Hospital Course: Patient is a 40 year old Caucasian male who was involuntarily committed by his family when they found him  holding a gun to his head head and wanting to shoot himself. He was very agitated in the emergency room and he was given Geodon 20 mg at the time. This morning on the behavioral health unit patient presents with high anxiety and irritability. He states that Thalia Party understands him. States that he has been using every drug that is available to him because he is depressed about his life situation. States that his wife has not treated him well and his current girlfriend has not been treating him well. States that his 29 year old son does not respect him and it is very hard on him. He mood is very labile and it is at times difficult to understand his speech. He is endorsing high anxiety and depression and states that he needs something to help him.  [name] was admitted to the hospital with his UDS reports showing positive cocaine & Benzodiazepine. His BAL was 219 per toxicology tests results. He admits to having been drinking a lot, using all/any drugs out there & it has worsened. He was also presenting with depressed mood which he attributed to his family not giving him any respect. He was here for alcohol/drug detox & mood stabilization treatments. [name]'s recent lab reports indicated elevated liver enzymes (AST), possibly from chronic alcoholism. As a result, not a candidate for Librium detoxification treatment protocols. This is because Librium is a long acting  Benzodiazepine, not suitable for a compromised liver enzymes. His detoxification treatment was achieved using Ativan detox regimen on a tapering dose format. By using Ativan detox regimen, [name] received a cleaner detoxification treatment without the lingering adverse effects of the Librium capsules in his system. He was enrolled in the group counseling sessions, AA/NA meetings being offered and held on this unit. He participated and learned coping skills. He tolerated his treatment regimen without any significant adverse effects and or reactions  reported.  Besides the detoxification treatments, Excell was also medicated & discharged on; Trazodone 50 mg for insomnia, Gabapentin 100 mg for agitation & Sertraline 50 mg for depression. Harlie has completed detox treatment and his mood is stable. This is evidenced by his reports of improved mood and absence of substance withdrawal symptoms. He will resume psychiatric care and routine medication management at the [facility] in [place], [place]. He was encouraged to join/attend AA/NA meetings being offered and held within his community to achieve & maintain maximum sobriety.   Upon his hospital discharge, [name] adamantly denies any suicidal, homicidal ideations, auditory, visual hallucinations, delusional thoughts, paranoia & or substance withdrawal symptoms. He left [facility] with all personal belongings in no apparent distress. He received a 7 days worth supply samples of his [facility] discharge medications provided by [facility] pharmacy. Transportation per brother.  Physical Findings: AIMS: Facial and Oral Movements Muscles of Facial Expression: None, normal Lips and Perioral Area: None, normal Jaw: None, normal Tongue: None, normal,Extremity Movements Upper (arms, wrists, hands, fingers): None, normal Lower (legs, knees, ankles, toes): None, normal, Trunk Movements Neck, shoulders, hips: None, normal, Overall Severity Severity of abnormal movements (highest score from questions above): None, normal Incapacitation due to abnormal movements: None, normal Patient's awareness of abnormal movements (rate only patient's report): No Awareness, Dental Status Current problems with teeth and/or dentures?: No Does patient usually wear dentures?: No  CIWA:  CIWA-Ar Total: 0 COWS:  COWS Total Score: 3  Musculoskeletal: Strength & Muscle Tone: within normal limits Gait & Station: normal Patient leans: N/A  Psychiatric Specialty Exam: Review of Systems  Constitutional: Negative.   HENT: Negative.   Eyes:  Negative.   Respiratory: Negative.   Cardiovascular: Negative.   Gastrointestinal: Negative.   Genitourinary: Negative.   Musculoskeletal: Negative.   Skin: Negative.   Neurological: Negative.   Endo/Heme/Allergies: Negative.   Psychiatric/Behavioral: Positive for depression (Stable) and substance abuse (Hx. Polysubstance dependence). Negative for suicidal ideas, hallucinations and memory loss. The patient has insomnia (Stable). The patient is not nervous/anxious.     Blood pressure 109/67, pulse 87, temperature 97.7 F (36.5 C), temperature source Oral, resp. rate 18, height  (1.676 m), weight 64.978 kg (143 lb 4 oz).Body mass index is 23.13 kg/(m^2).  See Md's SRA   Have you used any form of tobacco in the last 30 days? (Cigarettes, Smokeless Tobacco, Cigars, and/or Pipes): Yes  Has this patient used any form of tobacco in the last 30 days? (Cigarettes, Smokeless Tobacco, Cigars, and/or Pipes) Yes, Yes, A prescription for an FDA-approved tobacco cessation medication was offered at discharge and the patient refused  Blood Alcohol level:  Lab Results  Component Value Date   [facility] 219* 02/04/2016   Metabolic Disorder Labs:  No results found for: HGBA1C, MPG No results found for: PROLACTIN Lab Results  Component Value Date   CHOL  09/18/2010    183        ATP III CLASSIFICATION:  <200     mg/dL   Desirable  200-239  mg/dL   Borderline High  >=161    mg/dL   High          TRIG 096 09/18/2010   HDL 45 09/18/2010   CHOLHDL 4.1 09/18/2010   VLDL 24 09/18/2010   LDLCALC * 09/18/2010    114        Total Cholesterol/HDL:CHD Risk Coronary Heart Disease Risk Table                     Men   Women  1/2 Average Risk   3.4   3.3  Average Risk       5.0   4.4  2 X Average Risk   9.6   7.1  3 X Average Risk  23.4   11.0        Use the calculated Patient Ratio above and the CHD Risk Table to determine the patient's CHD Risk.        ATP III CLASSIFICATION (LDL):  <100     mg/dL    Optimal  045-409  mg/dL   Near or Above                    Optimal  130-159  mg/dL   Borderline  811-914  mg/dL   High  >782     mg/dL   Very High   See Psychiatric Specialty Exam and Suicide Risk Assessment completed by Attending Physician prior to discharge.  Discharge destination:  Home  Is patient on multiple antipsychotic therapies at discharge:  No   Has Patient had three or more failed trials of antipsychotic monotherapy by history:  No  Recommended Plan for Multiple Antipsychotic Therapies: NA    Medication List    TAKE these medications      Indication   gabapentin 100 MG capsule  Commonly known as:  NEURONTIN  Take 2 capsules (200 mg total) by mouth 3 (three) times daily. For agitation   Indication:  Agitation, anxiety     nicotine polacrilex 2 MG gum  Commonly known as:  NICORETTE  Take 1 each (2 mg total) by mouth as needed for smoking cessation.   Indication:  Nicotine Addiction     sertraline 50 MG tablet  Commonly known as:  ZOLOFT  Take 1 tablet (50 mg total) by mouth daily. For depression   Indication:  Major Depressive Disorder     traZODone 50 MG tablet  Commonly known as:  DESYREL  Take 1 tablet (50 mg total) by mouth at bedtime as needed for sleep.   Indication:  Trouble Sleeping       Follow-up Information    Follow up with [facility] On 02/13/2016.   Why:  Assessment for therapy and medication management services at 8am on Monday May 22nd. Please bring ID and any proof of income.    Contact information:   [address] [place], [place] [address] Phone: [phone] Fax: [phone]     Follow-up recommendations:  Activity:  As tolerated Diet: As recommended by your primary care doctor. Keep all scheduled follow-up appointments as recommended.  Comments: Take all your medications as prescribed by your mental healthcare provider. Report any adverse effects and or reactions from your medicines to your outpatient provider promptly. Patient  is instructed and cautioned to not engage in alcohol and or illegal drug use while on prescription medicines. In the event of worsening symptoms, patient is instructed to call the crisis hotline, 911 and or go to the  nearest ED for appropriate evaluation and treatment of symptoms. Follow-up with your primary care provider for your other medical issues, concerns and or health care needs.   Signed: Sanjuana KavaNwoko, Teila Skalsky I, NP, PMHNP, FNP-BC 02/10/2016, 10:17 AM

## 2016-02-10 NOTE — Tx Team (Signed)
Interdisciplinary Treatment Plan Update (Adult) Date: 02/10/2016    Time Reviewed: 9:30 AM  Progress in Treatment: Attending groups: Yes Participating in groups: Minimally, when he attends  Taking medication as prescribed: Yes Tolerating medication: Yes Family/Significant other contact made: Pt refused to consent to family contact. SPE completed with pt.  Patient understands diagnosis: Yes Discussing patient identified problems/goals with staff: Yes Medical problems stabilized or resolved: Yes Denies suicidal/homicidal ideation: Yes Issues/concerns per patient self-inventory: Yes Other:  New problem(s) identified: N/A  Discharge Plan or Barriers: pt plans to return home; follow-up for outpatient mental health services at [facility]. Pt also provided with AA/NA list for [place] pamphlet.   Reason for Continuation of Hospitalization:  none  Estimated length of stay: d/c today  Patient is a 40 year old male 40 y.o. male brought in by a [facility] Deputy due to threatening to kill himself and having his hand on the gun in the home. Pt reported that he was trying to kill himself. Stressors include: while he was incarcerated his wife lost custody of his son and his bother has custody but does not allow him to see him. Pt's UDS is positive for cocaine and benzodiazepines. Patient will benefit from crisis stabilization, medication evaluation, group therapy and psycho education in addition to case management for discharge planning. At discharge, it is recommended that Pt remain compliant with established discharge plan and continued treatment.   Review of initial/current patient goals per problem list:  1. Goal(s): Patient will participate in aftercare plan   Met: Yes   Target date: 3-5 days post admission date   As evidenced by: Patient will participate within aftercare plan AEB aftercare provider and housing plan at discharge  being identified.  5/16: Goal not met: CSW assessing for appropriate referrals for pt and will have follow up secured prior to d/c.   5/19: Goal met. Pt to return home; follow-up outpatient.   2. Goal (s): Patient will exhibit decreased depressive symptoms and suicidal ideations.   Met: Yes   Target date: 3-5 days post admission date   As evidenced by: Patient will utilize self rating of depression at 3 or below and demonstrate decreased signs of depression or be deemed stable for discharge by MD.  5/16: Goal not met: Pt presents with flat affect and depressed mood.  Pt admitted with depression rating of 10.  Pt to show decreased sign of depression and a rating of 3 or less before d/c.    5/19: goal met. Pt rates depression as low and presents with pleasant mood/calm affect. Denies Si/HI/AVH.   3. Goal(s): Patient will demonstrate decreased signs and symptoms of anxiety.   Met: Yes   Target date: 3-5 days post admission date   As evidenced by: Patient will utilize self rating of anxiety at 3 or below and demonstrated decreased signs of anxiety, or be deemed stable for discharge by MD  5/16: Goal not met: Pt presents with anxious mood and affect.  Pt admitted with anxiety rating of 10.  Pt to show decreased sign of anxiety and a rating of 3 or less before d/c.  5/19: Goal met. Pt rates anxiety as none and present with pleasant mood and calm affect.    4. Goal(s): Patient will demonstrate decreased signs of withdrawal due to substance abuse   Met: Yes   Target date: 3-5 days post admission date   As evidenced by: Patient will produce a CIWA/COWS score of 0,  have stable vitals signs, and no symptoms of withdrawal  5/16: Goal not met: Pt continues to have withdrawal symptoms of agitation, anxiety and a CIWA score of a 4/COWS score of 3.  Pt to show decrease withdrawal symptoms prior to d/c.   5/19: Goal met. Pt reports no signs of withdrawal with CIWA score of 0 and stable  vitals.   Attendees: Patient:    Family:    Physician: Dr. Shea Evans MD  02/10/2016 10:22 AM   Nursing: Brita Romp RN  02/10/2016 10:22 AM   Clinical Social Worker: Maxie Better, LCSW 02/10/2016 10:22 AM   Other: Peri Maris, [place]  02/10/2016 10:22 AM   Other: Norberto Sorenson, P4CC 02/10/2016 10:22 AM   Other: Lars Pinks, Case Manager 02/10/2016 10:22 AM   Other: Agustina Caroli, May Augustin, NP 02/10/2016 10:22 AM   Other:      Scribe for Treatment Team:  Maxie Better, MSW, LCSW Clinical Social Worker 02/10/2016 10:22 AM

## 2016-02-10 NOTE — Progress Notes (Signed)
Adult Psychoeducational Group Note  Date:  02/10/2016 Time:  12:07 PM  Group Topic/Focus:  Recovery Goals:   The focus of this group is to identify appropriate goals for recovery and establish a plan to achieve them.  Participation Level:  Active  Participation Quality:  Appropriate  Affect:  Appropriate  Cognitive:  Alert  Insight: Appropriate and Good  Engagement in Group:  Engaged  Modes of Intervention:  Discussion  Additional Comments:  Pt participated in group this morning.  Pt states his biggest fear is something happening to his son as they are very close.  Pt states that he is going to change his group of friends and cell phone number to avoid a relapse.  Rambo Sarafian R Danese Dorsainvil 02/10/2016, 12:07 PM
